# Patient Record
Sex: Female | Born: 1965 | Hispanic: No | Smoking: Never smoker
Health system: Southern US, Community
[De-identification: ages and names within clinical notes are randomized; demographics above are authoritative.]

## PROBLEM LIST (undated history)

## (undated) DIAGNOSIS — N289 Disorder of kidney and ureter, unspecified: Secondary | ICD-10-CM

## (undated) DIAGNOSIS — N200 Calculus of kidney: Secondary | ICD-10-CM

## (undated) HISTORY — PX: APPENDECTOMY: SHX54

---

## 1999-11-04 ENCOUNTER — Other Ambulatory Visit: Admission: RE | Admit: 1999-11-04 | Discharge: 1999-11-04 | Payer: Self-pay | Admitting: Obstetrics and Gynecology

## 2000-04-03 ENCOUNTER — Encounter: Admission: RE | Admit: 2000-04-03 | Discharge: 2000-04-03 | Payer: Self-pay | Admitting: Family Medicine

## 2000-04-03 ENCOUNTER — Encounter: Payer: Self-pay | Admitting: Family Medicine

## 2004-11-10 ENCOUNTER — Ambulatory Visit: Payer: Self-pay | Admitting: Family Medicine

## 2005-01-24 ENCOUNTER — Ambulatory Visit: Payer: Self-pay | Admitting: Family Medicine

## 2005-01-24 ENCOUNTER — Ambulatory Visit: Payer: Self-pay | Admitting: *Deleted

## 2005-01-27 ENCOUNTER — Ambulatory Visit: Payer: Self-pay | Admitting: Family Medicine

## 2005-04-12 ENCOUNTER — Ambulatory Visit: Payer: Self-pay | Admitting: Family Medicine

## 2005-04-15 ENCOUNTER — Ambulatory Visit (HOSPITAL_COMMUNITY): Admission: RE | Admit: 2005-04-15 | Discharge: 2005-04-15 | Payer: Self-pay | Admitting: Family Medicine

## 2005-04-20 ENCOUNTER — Ambulatory Visit (HOSPITAL_COMMUNITY): Admission: RE | Admit: 2005-04-20 | Discharge: 2005-04-20 | Payer: Self-pay | Admitting: Internal Medicine

## 2005-04-25 ENCOUNTER — Ambulatory Visit: Payer: Self-pay | Admitting: Family Medicine

## 2005-11-08 ENCOUNTER — Emergency Department (HOSPITAL_COMMUNITY): Admission: EM | Admit: 2005-11-08 | Discharge: 2005-11-08 | Payer: Self-pay | Admitting: Emergency Medicine

## 2005-11-10 ENCOUNTER — Ambulatory Visit: Payer: Self-pay | Admitting: Family Medicine

## 2006-11-06 ENCOUNTER — Ambulatory Visit: Payer: Self-pay | Admitting: Family Medicine

## 2007-08-08 ENCOUNTER — Encounter (INDEPENDENT_AMBULATORY_CARE_PROVIDER_SITE_OTHER): Payer: Self-pay | Admitting: *Deleted

## 2010-09-02 ENCOUNTER — Emergency Department (HOSPITAL_COMMUNITY)
Admission: EM | Admit: 2010-09-02 | Discharge: 2010-09-02 | Payer: Self-pay | Source: Home / Self Care | Admitting: Emergency Medicine

## 2010-09-08 ENCOUNTER — Emergency Department (HOSPITAL_COMMUNITY): Admission: EM | Admit: 2010-09-08 | Discharge: 2010-09-09 | Payer: Self-pay | Admitting: Emergency Medicine

## 2010-12-12 ENCOUNTER — Encounter: Payer: Self-pay | Admitting: Internal Medicine

## 2010-12-28 ENCOUNTER — Encounter: Payer: Self-pay | Admitting: Internal Medicine

## 2010-12-28 LAB — CONVERTED CEMR LAB
BUN: 13 mg/dL (ref 6–23)
CO2: 27 meq/L (ref 19–32)
Chloride: 102 meq/L (ref 96–112)
Creatinine, Ser: 0.58 mg/dL (ref 0.40–1.20)
HCT: 39.8 % (ref 36.0–46.0)
Hemoglobin: 13 g/dL (ref 12.0–15.0)
WBC: 6.5 10*3/uL (ref 4.0–10.5)

## 2010-12-30 ENCOUNTER — Encounter: Payer: Self-pay | Admitting: Internal Medicine

## 2010-12-30 ENCOUNTER — Other Ambulatory Visit (HOSPITAL_COMMUNITY): Payer: Self-pay | Admitting: Family Medicine

## 2010-12-30 DIAGNOSIS — N200 Calculus of kidney: Secondary | ICD-10-CM

## 2010-12-30 DIAGNOSIS — N1330 Unspecified hydronephrosis: Secondary | ICD-10-CM

## 2011-01-03 ENCOUNTER — Ambulatory Visit (HOSPITAL_COMMUNITY)
Admission: RE | Admit: 2011-01-03 | Discharge: 2011-01-03 | Disposition: A | Discharge status: Home / Self Care | Payer: Self-pay | Source: Ambulatory Visit | Attending: Family Medicine | Admitting: Family Medicine

## 2011-01-03 DIAGNOSIS — R109 Unspecified abdominal pain: Secondary | ICD-10-CM | POA: Insufficient documentation

## 2011-01-03 DIAGNOSIS — N1330 Unspecified hydronephrosis: Secondary | ICD-10-CM

## 2011-01-03 DIAGNOSIS — N200 Calculus of kidney: Secondary | ICD-10-CM | POA: Insufficient documentation

## 2011-01-11 ENCOUNTER — Encounter (INDEPENDENT_AMBULATORY_CARE_PROVIDER_SITE_OTHER): Payer: Self-pay | Admitting: *Deleted

## 2011-01-11 LAB — CONVERTED CEMR LAB
Calcium Ionized: 1.25 mmol/L (ref 1.12–1.32)
Uric Acid, 24H Ur: 275.2 mg/(24.h) (ref 250–750)
pH: 6.5 (ref 5.0–8.0)

## 2011-02-02 LAB — URINALYSIS, ROUTINE W REFLEX MICROSCOPIC
Bilirubin Urine: NEGATIVE
Glucose, UA: NEGATIVE mg/dL
Ketones, ur: NEGATIVE mg/dL
Leukocytes, UA: NEGATIVE
Nitrite: NEGATIVE
Protein, ur: NEGATIVE mg/dL
Specific Gravity, Urine: 1.013 (ref 1.005–1.030)
Urobilinogen, UA: 0.2 mg/dL (ref 0.0–1.0)
pH: 6.5 (ref 5.0–8.0)

## 2011-02-02 LAB — URINE MICROSCOPIC-ADD ON

## 2011-02-02 LAB — URINE CULTURE: Culture: NO GROWTH

## 2011-02-02 LAB — POCT PREGNANCY, URINE: Preg Test, Ur: NEGATIVE

## 2011-02-03 LAB — URINALYSIS, ROUTINE W REFLEX MICROSCOPIC
Bilirubin Urine: NEGATIVE
Glucose, UA: NEGATIVE mg/dL
Ketones, ur: NEGATIVE mg/dL
pH: 5.5 (ref 5.0–8.0)

## 2011-02-03 LAB — URINE CULTURE: Culture  Setup Time: 201110131339

## 2011-02-03 LAB — CBC
MCH: 30.2 pg (ref 26.0–34.0)
MCHC: 33.9 g/dL (ref 30.0–36.0)
Platelets: 176 10*3/uL (ref 150–400)
RBC: 4.27 MIL/uL (ref 3.87–5.11)

## 2011-02-03 LAB — COMPREHENSIVE METABOLIC PANEL
AST: 29 U/L (ref 0–37)
Albumin: 4 g/dL (ref 3.5–5.2)
Calcium: 8.8 mg/dL (ref 8.4–10.5)
Chloride: 108 mEq/L (ref 96–112)
Creatinine, Ser: 1.09 mg/dL (ref 0.4–1.2)
GFR calc Af Amer: >60 mL/min (ref >60)
Sodium: 136 mEq/L (ref 135–145)

## 2011-02-03 LAB — DIFFERENTIAL
Eosinophils Relative: 0 % (ref 0–5)
Lymphocytes Relative: 10 % — ABNORMAL LOW (ref 12–46)
Lymphs Abs: 0.6 10*3/uL — ABNORMAL LOW (ref 0.7–4.0)
Monocytes Absolute: 0 10*3/uL — ABNORMAL LOW (ref 0.1–1.0)

## 2011-02-03 LAB — URINE MICROSCOPIC-ADD ON

## 2011-04-13 ENCOUNTER — Emergency Department (HOSPITAL_COMMUNITY)
Admission: EM | Admit: 2011-04-13 | Discharge: 2011-04-14 | Disposition: A | Discharge status: Home / Self Care | Payer: Self-pay | Attending: Emergency Medicine | Admitting: Emergency Medicine

## 2011-04-13 ENCOUNTER — Emergency Department (HOSPITAL_COMMUNITY): Payer: Self-pay

## 2011-04-13 DIAGNOSIS — R109 Unspecified abdominal pain: Secondary | ICD-10-CM | POA: Insufficient documentation

## 2011-04-13 LAB — URINALYSIS, ROUTINE W REFLEX MICROSCOPIC
Glucose, UA: NEGATIVE mg/dL
Protein, ur: NEGATIVE mg/dL
pH: 7.5 (ref 5.0–8.0)

## 2011-10-12 IMAGING — CT CT ABD-PELV W/O CM
2 of 4 series · 17 of 46 positions shown, 19 images · non-contrast
Comparison: None.

CLINICAL DATA: Renal calculi.  Bilateral flank pain 3 weeks ago.

CT ABDOMEN AND PELVIS WITHOUT CONTRAST
TECHNIQUE: Multidetector CT imaging of the abdomen and pelvis was
performed following the standard protocol without intravenous
contrast.

[Series 3: a/p w/o 5.0 b31f st · axial · non-contrast · 0.59mm/px · z∈[-445,-75]mm · 14 of 82 slices shown, 16 images]
[im 4/82  soft-tissue]
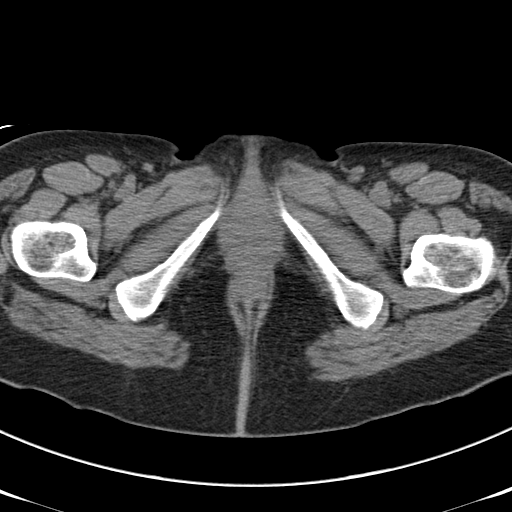
[im 4/82  bone]
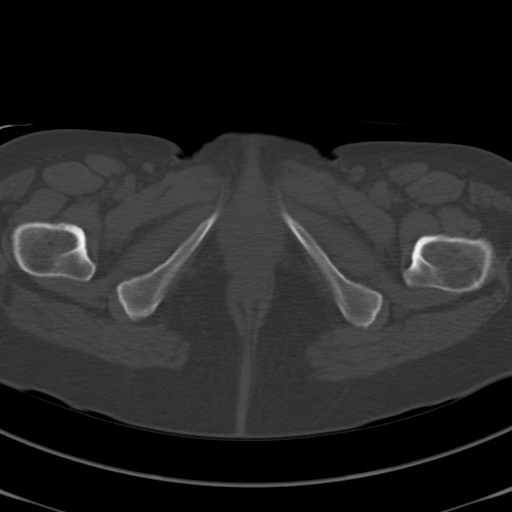
[im 11/82  soft-tissue]
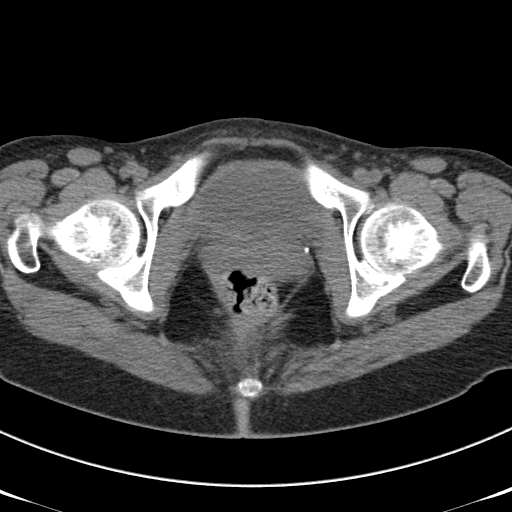
[im 17/82  soft-tissue]
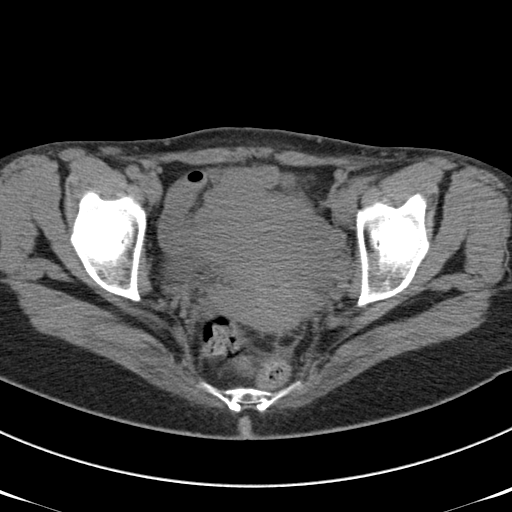
[im 21/82  soft-tissue]
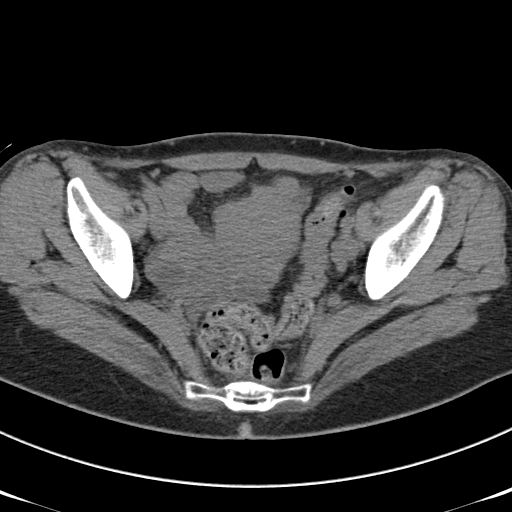
[im 28/82  soft-tissue]
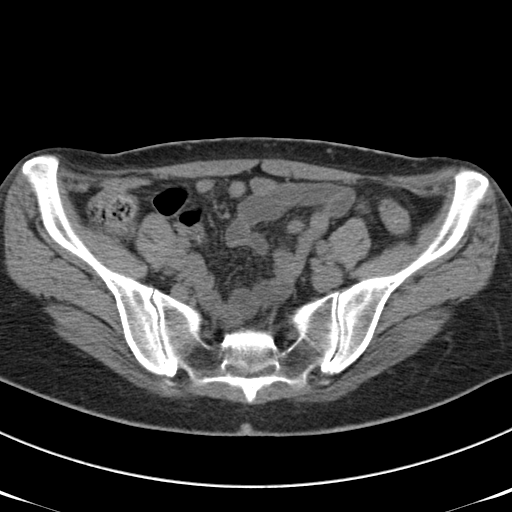
[im 34/82  soft-tissue]
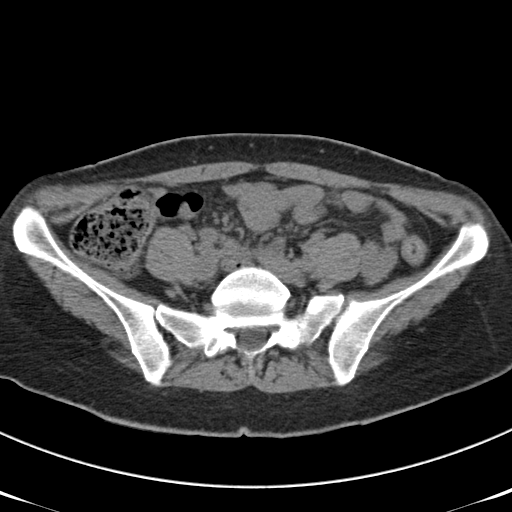
[im 38/82  soft-tissue]
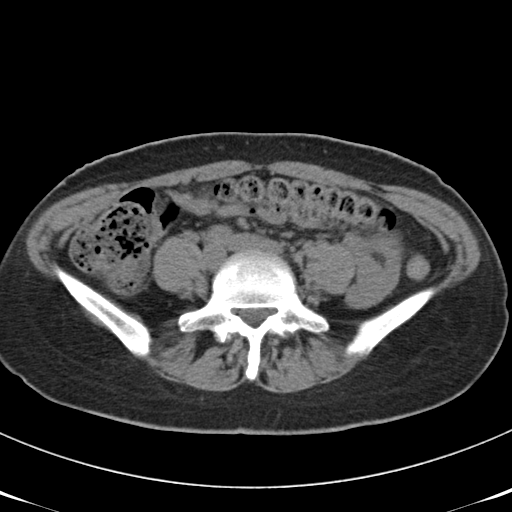
[im 44/82  soft-tissue]
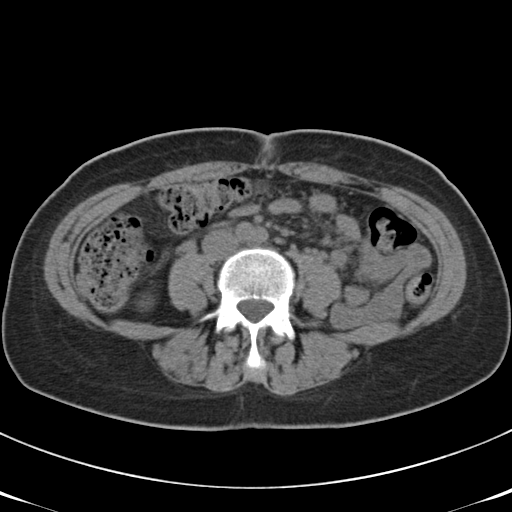
[im 48/82  soft-tissue]
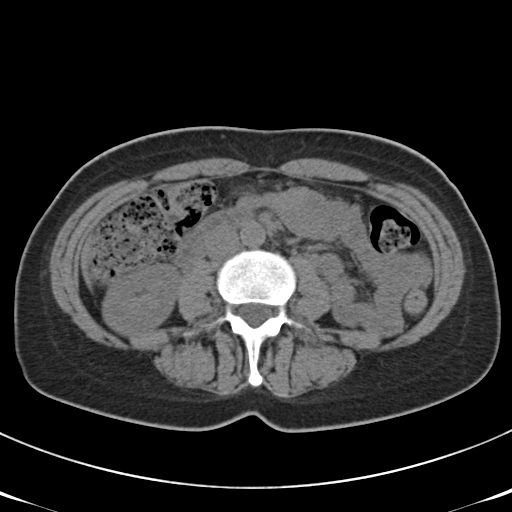
[im 48/82  bone]
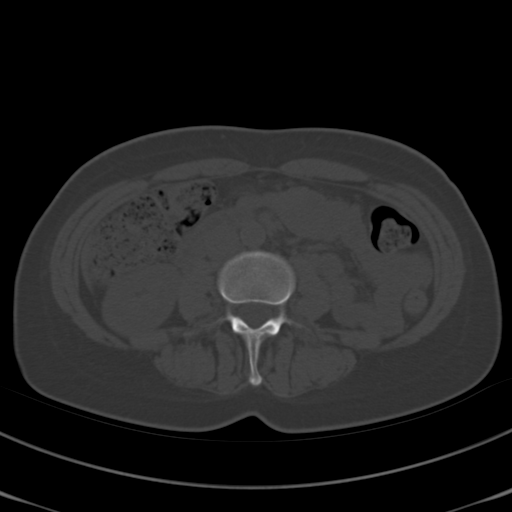
[im 55/82  soft-tissue]
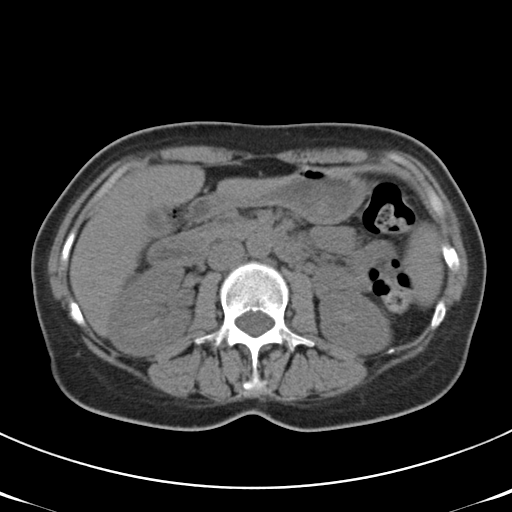
[im 61/82  soft-tissue]
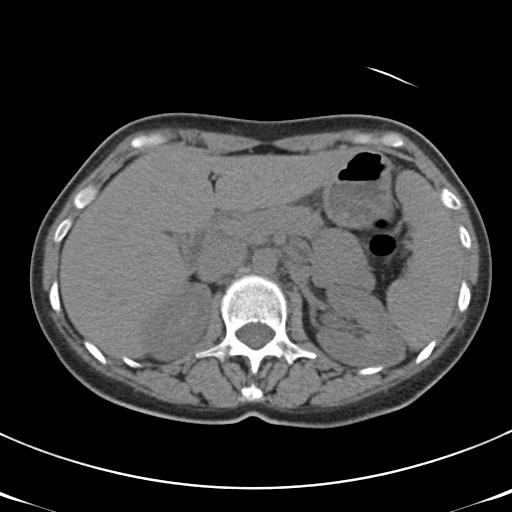
[im 65/82  soft-tissue]
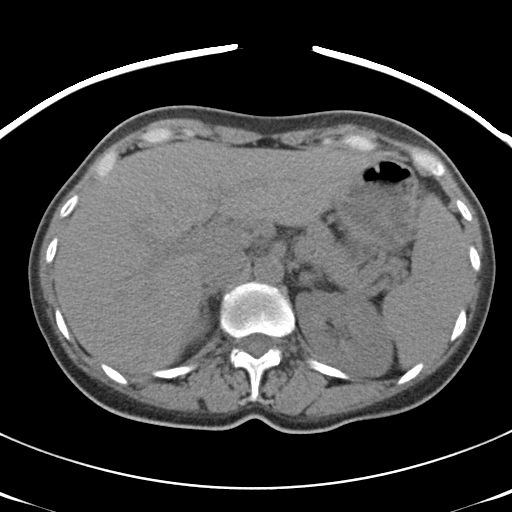
[im 71/82  soft-tissue]
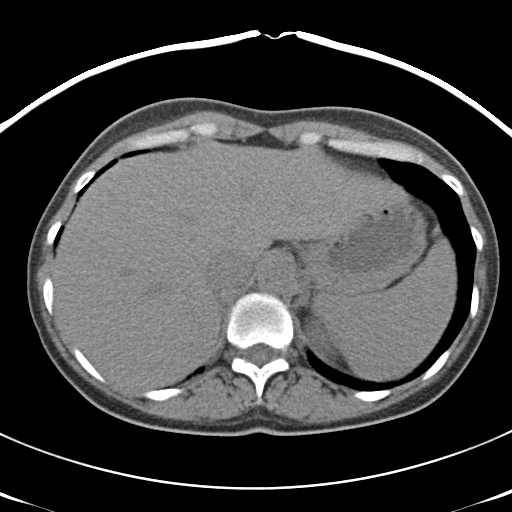
[im 78/82  soft-tissue]
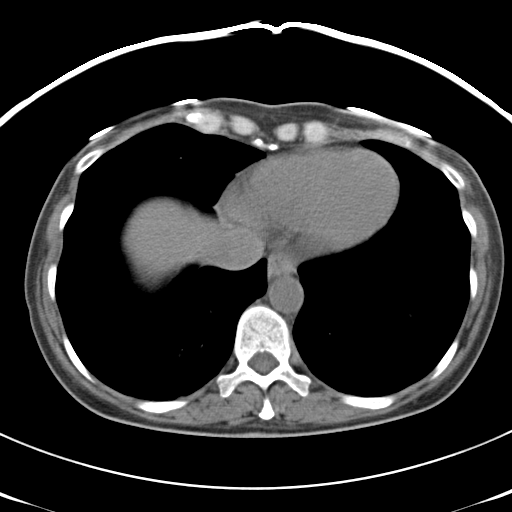

[Series 602: cor · coronal · 0.80mm/px · 3 of 81 slices shown]
[im 27/81  soft-tissue]
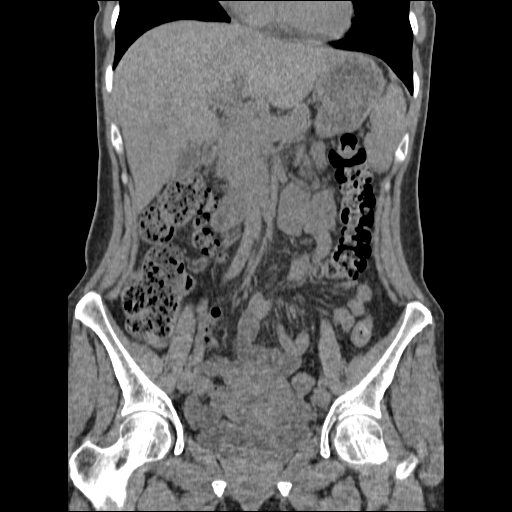
[im 36/81  soft-tissue]
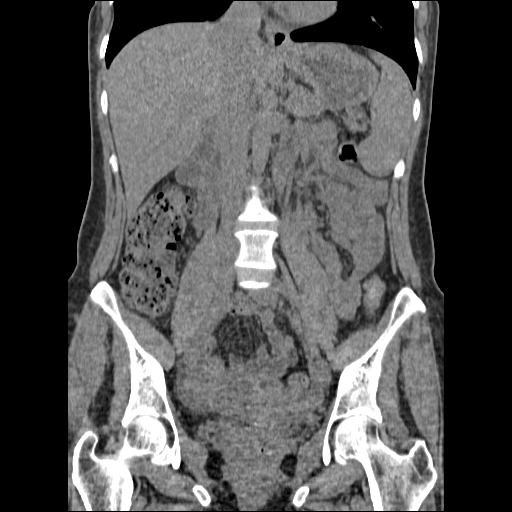
[im 45/81  soft-tissue]
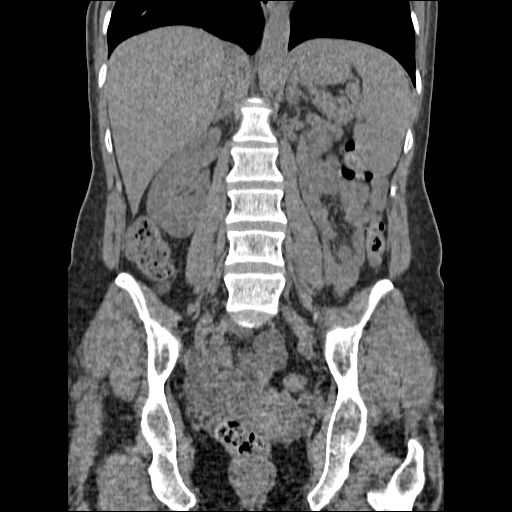

[17 of 46 positions shown; findings below may reference images not displayed]

FINDINGS: There is a 2 mm left mid kidney nonobstructive calculus
and a 102 mm left kidney lower pole nonobstructive calculus.  There
appear to be to the right mid kidney calculi measuring
approximately 2 mm in diameter, and potentially several other tiny
nonobstructive right renal calculi.

No hydronephrosis or hydroureter.  A stable left pelvic phlebolith
is present.

The liver, spleen, pancreas, and adrenal glands appear
unremarkable.

The gallbladder appears contracted.

Small retroperitoneal lymph nodes are not pathologically enlarged
by size criteria.

Trace free pelvic fluid noted. No pathologic pelvic adenopathy is
identified.

Urinary bladder appears unremarkable.  The appendix is not well
visualized but no right lower quadrant inflammatory process is
seen.
IMPRESSION: 1.  Bilateral nonobstructive nephrolithiasis.  No ureteral calculus
or hydronephrosis identified.
2.  Trace free pelvic fluid may be physiologic.

## 2012-08-09 ENCOUNTER — Other Ambulatory Visit (HOSPITAL_COMMUNITY): Payer: Self-pay | Admitting: Internal Medicine

## 2012-08-09 DIAGNOSIS — Z1231 Encounter for screening mammogram for malignant neoplasm of breast: Secondary | ICD-10-CM

## 2012-08-16 DIAGNOSIS — S6990XA Unspecified injury of unspecified wrist, hand and finger(s), initial encounter: Secondary | ICD-10-CM | POA: Insufficient documentation

## 2012-08-16 DIAGNOSIS — N289 Disorder of kidney and ureter, unspecified: Secondary | ICD-10-CM | POA: Insufficient documentation

## 2012-08-16 DIAGNOSIS — IMO0002 Reserved for concepts with insufficient information to code with codable children: Secondary | ICD-10-CM | POA: Insufficient documentation

## 2012-08-16 NOTE — ED Notes (Signed)
Pt states she was playing with her daughter and injured her R third finger to where the joint was swollen and she felt "nerve pain" going up the ulnar side of her R arm.  Then she accidentally shut the door on the same finger and pain increased.

## 2012-08-17 ENCOUNTER — Emergency Department (HOSPITAL_COMMUNITY): Payer: Medicaid Other

## 2012-08-17 ENCOUNTER — Encounter (HOSPITAL_COMMUNITY): Payer: Self-pay | Admitting: *Deleted

## 2012-08-17 ENCOUNTER — Emergency Department (HOSPITAL_COMMUNITY)
Admission: EM | Admit: 2012-08-17 | Discharge: 2012-08-17 | Disposition: A | Discharge status: Home / Self Care | Payer: Medicaid Other | Attending: Emergency Medicine | Admitting: Emergency Medicine

## 2012-08-17 DIAGNOSIS — S6000XA Contusion of unspecified finger without damage to nail, initial encounter: Secondary | ICD-10-CM

## 2012-08-17 HISTORY — DX: Disorder of kidney and ureter, unspecified: N28.9

## 2012-08-17 MED ORDER — HYDROCODONE-ACETAMINOPHEN 5-325 MG PO TABS
1.0000 | ORAL_TABLET | Freq: Once | ORAL | Status: AC
  Administered 2012-08-17: 1 via ORAL
  Filled 2012-08-17: qty 1

## 2012-08-17 MED ORDER — HYDROCODONE-ACETAMINOPHEN 5-500 MG PO TABS: 1.0000 | ORAL_TABLET | Freq: Four times a day (QID) | ORAL | Status: DC | PRN

## 2012-08-17 NOTE — ED Provider Notes (Signed)
History     CSN: [id number]  Arrival date & time 08/16/12  2350   None     Chief Complaint  Patient presents with  . Finger Injury    (Consider location/radiation/quality/duration/timing/severity/associated sxs/prior treatment) Patient is a 46 y.o. female presenting with hand injury.  Hand Injury  The incident occurred 3 to 5 hours ago. The injury mechanism was a direct blow. The pain is present in the right fingers. The quality of the pain is described as aching. The pain is at a severity of 4/10. The pain is mild. The pain has been constant since the incident. She reports no foreign bodies present.    Past Medical History  Diagnosis Date  . Renal disorder     kidney stones    History reviewed. No pertinent past surgical history.  No family history on file.  History  Substance Use Topics  . Smoking status: Never Smoker   . Smokeless tobacco: Not on file  . Alcohol Use: No    OB History    Grav Para Term Preterm Abortions TAB SAB Ect Mult Living                  Review of Systems  Musculoskeletal: Positive for joint swelling.  All other systems reviewed and are negative.    Allergies  Review of patient's allergies indicates no known allergies.  Home Medications  No current outpatient prescriptions on file.  BP 132/81  Pulse 71  Temp 98.6 F (37 C) (Oral)  Resp 18  SpO2 98%  Physical Exam  Constitutional: She is oriented to person, place, and time. She appears well-developed and well-nourished.  HENT:  Head: Normocephalic and atraumatic.  Eyes: Conjunctivae normal and EOM are normal. Pupils are equal, round, and reactive to light.  Neck: Normal range of motion.  Cardiovascular: Normal rate, regular rhythm and normal heart sounds.   Pulmonary/Chest: Effort normal and breath sounds normal.  Abdominal: Soft. Bowel sounds are normal.  Musculoskeletal: Normal range of motion.       Tenderness to palpation to rt 3 finger  Neurological: She is alert  and oriented to person, place, and time.  Skin: Skin is warm and dry.  Psychiatric: She has a normal mood and affect. Her behavior is normal.    ED Course  Procedures (including critical care time)  Labs Reviewed - No data to display Dg Hand Complete Right  08/17/2012  *RADIOLOGY REPORT*  Clinical Data: 46 year old female blunt trauma to the middle finger.  Pain.  RIGHT HAND - COMPLETE 3+ VIEW  Comparison: None.  Findings: Distal radius and ulna intact.  Carpal bone alignment within normal limits.  Joint spaces preserved.  Osseous structures of the right third ray are intact.  No acute fracture identified.  IMPRESSION: No acute fracture or dislocation identified about the right hand.   Original Report Authenticated By: Harley Hallmark, M.D.      No diagnosis found.    MDM  No fracture noted.  Improved.  Will dc to [facility] outpt,  Ret new/worsening sxs        Gerhardt Gleed Lytle Michaels, MD 08/17/12 0246

## 2012-08-20 ENCOUNTER — Ambulatory Visit (HOSPITAL_COMMUNITY)
Admission: RE | Admit: 2012-08-20 | Discharge: 2012-08-20 | Disposition: A | Discharge status: Home / Self Care | Payer: Self-pay | Source: Ambulatory Visit | Attending: Internal Medicine | Admitting: Internal Medicine

## 2012-08-20 DIAGNOSIS — Z1231 Encounter for screening mammogram for malignant neoplasm of breast: Secondary | ICD-10-CM | POA: Insufficient documentation

## 2013-03-17 ENCOUNTER — Emergency Department (HOSPITAL_COMMUNITY): Payer: Medicaid Other

## 2013-03-17 ENCOUNTER — Emergency Department (HOSPITAL_COMMUNITY)
Admission: EM | Admit: 2013-03-17 | Discharge: 2013-03-17 | Disposition: A | Discharge status: Home / Self Care | Payer: Medicaid Other | Attending: Emergency Medicine | Admitting: Emergency Medicine

## 2013-03-17 ENCOUNTER — Encounter (HOSPITAL_COMMUNITY): Payer: Self-pay | Admitting: *Deleted

## 2013-03-17 DIAGNOSIS — Z79899 Other long term (current) drug therapy: Secondary | ICD-10-CM | POA: Insufficient documentation

## 2013-03-17 DIAGNOSIS — Z87442 Personal history of urinary calculi: Secondary | ICD-10-CM | POA: Insufficient documentation

## 2013-03-17 DIAGNOSIS — R109 Unspecified abdominal pain: Secondary | ICD-10-CM

## 2013-03-17 HISTORY — DX: Calculus of kidney: N20.0

## 2013-03-17 LAB — CBC WITH DIFFERENTIAL/PLATELET
Basophils Absolute: 0 10*3/uL (ref 0.0–0.1)
HCT: 36.4 % (ref 36.0–46.0)
Lymphocytes Relative: 47 % — ABNORMAL HIGH (ref 12–46)
Monocytes Absolute: 0.4 10*3/uL (ref 0.1–1.0)
Neutro Abs: 2.3 10*3/uL (ref 1.7–7.7)
Neutrophils Relative %: 43 % (ref 43–77)
RDW: 13.6 % (ref 11.5–15.5)
WBC: 5.3 10*3/uL (ref 4.0–10.5)

## 2013-03-17 LAB — URINALYSIS, ROUTINE W REFLEX MICROSCOPIC
Leukocytes, UA: NEGATIVE
Nitrite: NEGATIVE
Specific Gravity, Urine: 1.021 (ref 1.005–1.030)
pH: 6 (ref 5.0–8.0)

## 2013-03-17 LAB — COMPREHENSIVE METABOLIC PANEL
AST: 13 U/L (ref 0–37)
Albumin: 3.6 g/dL (ref 3.5–5.2)
Alkaline Phosphatase: 54 U/L (ref 39–117)
Chloride: 105 mEq/L (ref 96–112)
Potassium: 3.1 mEq/L — ABNORMAL LOW (ref 3.5–5.1)
Sodium: 139 mEq/L (ref 135–145)
Total Bilirubin: 0.3 mg/dL (ref 0.3–1.2)
Total Protein: 7.1 g/dL (ref 6.0–8.3)

## 2013-03-17 MED ORDER — POTASSIUM CHLORIDE CRYS ER 20 MEQ PO TBCR
20.0000 meq | EXTENDED_RELEASE_TABLET | Freq: Once | ORAL | Status: AC
  Administered 2013-03-17: 20 meq via ORAL
  Filled 2013-03-17: qty 1

## 2013-03-17 MED ORDER — HYDROCODONE-ACETAMINOPHEN 5-325 MG PO TABS: 2.0000 | ORAL_TABLET | ORAL | Status: DC | PRN

## 2013-03-17 MED ORDER — HYDROCODONE-ACETAMINOPHEN 5-325 MG PO TABS
1.0000 | ORAL_TABLET | Freq: Once | ORAL | Status: AC
  Administered 2013-03-17: 1 via ORAL
  Filled 2013-03-17: qty 1

## 2013-03-17 MED ORDER — CYCLOBENZAPRINE HCL 10 MG PO TABS: 10.0000 mg | ORAL_TABLET | Freq: Two times a day (BID) | ORAL | Status: DC | PRN

## 2013-03-17 MED ORDER — CYCLOBENZAPRINE HCL 10 MG PO TABS
10.0000 mg | ORAL_TABLET | Freq: Once | ORAL | Status: AC
  Administered 2013-03-17: 10 mg via ORAL
  Filled 2013-03-17: qty 1

## 2013-03-17 MED ORDER — POTASSIUM CHLORIDE CRYS ER 20 MEQ PO TBCR: 20.0000 meq | EXTENDED_RELEASE_TABLET | Freq: Two times a day (BID) | ORAL | Status: DC

## 2013-03-17 NOTE — ED Provider Notes (Signed)
History     CSN: [id number]  Arrival date & time 03/17/13  9562   First MD Initiated Contact with Patient 03/17/13 0145      Chief Complaint  Patient presents with  . Flank Pain    (Consider location/radiation/quality/duration/timing/severity/associated sxs/prior treatment) HPI Comments: Patient has a history of kidney stands that intermittently bothers her over the last several years.  Over the last couple, days.  She's had increased left flank pain, radiating to her left lower abdomen, denies any nausea, vomiting, diarrhea, fever, or dysuria.  Blood in her urine.  She's been taking over-the-counter Advil without significant pain relief.  She called her physician and was given an appointment date of May 29 she did not feel that she could wait that long  Patient is a 47 y.o. female presenting with flank pain. The history is provided by the patient.  Flank Pain This is a chronic problem. The current episode started in the past 7 days. The problem has been gradually worsening. Pertinent negatives include no abdominal pain, fever, myalgias, nausea or vomiting. Nothing aggravates the symptoms. She has tried NSAIDs for the symptoms. The treatment provided no relief.    Past Medical History  Diagnosis Date  . Renal disorder     kidney stones  . Kidney stones     No past surgical history on file.  History reviewed. No pertinent family history.  History  Substance Use Topics  . Smoking status: Never Smoker   . Smokeless tobacco: Not on file  . Alcohol Use: No    OB History   Grav Para Term Preterm Abortions TAB SAB Ect Mult Living                  Review of Systems  Constitutional: Negative for fever.  Gastrointestinal: Negative for nausea, vomiting and abdominal pain.  Genitourinary: Positive for flank pain. Negative for dysuria, hematuria and vaginal discharge.  Musculoskeletal: Negative for myalgias.  Skin: Negative for wound.  All other systems reviewed and are  negative.    Allergies  Review of patient's allergies indicates no known allergies.  Home Medications   Current Outpatient Rx  Name  Route  Sig  Dispense  Refill  . cyclobenzaprine (FLEXERIL) 10 MG tablet   Oral   Take 1 tablet (10 mg total) by mouth 2 (two) times daily as needed for muscle spasms.   20 tablet   0   . HYDROcodone-acetaminophen (NORCO/VICODIN) 5-325 MG per tablet   Oral   Take 2 tablets by mouth every 4 (four) hours as needed for pain.   6 tablet   0   . HYDROcodone-acetaminophen (VICODIN) 5-500 MG per tablet   Oral   Take 1-2 tablets by mouth every 6 (six) hours as needed for pain.   15 tablet   0   . potassium chloride SA (K-DUR,KLOR-CON) 20 MEQ tablet   Oral   Take 1 tablet (20 mEq total) by mouth 2 (two) times daily.   6 tablet   0     BP 106/79  Pulse 62  Temp(Src) 97.9 F (36.6 C) (Oral)  Resp 16  SpO2 100%  Physical Exam  Vitals reviewed. Constitutional: She is oriented to person, place, and time. She appears well-developed and well-nourished.  HENT:  Head: Normocephalic.  Eyes: Pupils are equal, round, and reactive to light.  Neck: Normal range of motion.  Cardiovascular: Normal rate.   Pulmonary/Chest: Effort normal.  Abdominal: Soft. Bowel sounds are normal.  Musculoskeletal: Normal range of  motion.  Neurological: She is alert and oriented to person, place, and time.  Skin: Skin is warm. No rash noted. No pallor.    ED Course  Procedures (including critical care time)  Labs Reviewed  CBC WITH DIFFERENTIAL - Abnormal; Notable for the following:    Lymphocytes Relative 47 (*)    All other components within normal limits  URINALYSIS, ROUTINE W REFLEX MICROSCOPIC - Abnormal; Notable for the following:    Hgb urine dipstick SMALL (*)    All other components within normal limits  COMPREHENSIVE METABOLIC PANEL - Abnormal; Notable for the following:    Potassium 3.1 (*)    All other components within normal limits  URINE  MICROSCOPIC-ADD ON   Ct Abdomen Pelvis Wo Contrast  03/17/2013  *RADIOLOGY REPORT*  Clinical Data: Left flank pain.  CT ABDOMEN AND PELVIS WITHOUT CONTRAST  Technique:  Multidetector CT imaging of the abdomen and pelvis was performed following the standard protocol without intravenous contrast.  Comparison: 04/13/2011  Findings: Emphysematous changes and scattered fibrosis in the lung bases.  Multiple tiny intrarenal stones bilaterally, measuring less than 2 mm diameter.  No pyelocaliectasis or ureterectasis of either kidney.  No ureteral or bladder stones are demonstrated.  No bladder wall thickening.  The unenhanced appearance of the liver, spleen, gallbladder, pancreas, adrenal glands, abdominal aorta, inferior vena cava, stomach, small bowel, and colon is unremarkable.  No free air or free fluid in the abdomen.  Pelvis:  Low attenuation lesion in the right adnexa measuring 4.1 cm consistent with an ovarian cyst.  This is new since the previous study and is likely to be physiologic in a premenopausal patient. Uterus and left adnexal structures are not enlarged.  No significant pelvic lymphadenopathy.  History of surgical absence of the appendix.  No evidence of diverticulitis.  No free or loculated pelvic fluid collections.  Normal alignment of the lumbar vertebrae.  IMPRESSION: Multiple tiny nonobstructing intrarenal stones bilaterally.  This appears stable since previous study.  No acute process demonstrated in the abdomen or pelvis on unenhanced imaging.  A 4.1 cm cyst in the right adnexa.  This is likely physiologic in a premenopausal patient.   Original Report Authenticated By: Burman Nieves, M.D.      1. Flank pain       MDM  Patient has small amount of blood in her urine.  Will CT to rule out stones        Arman Filter, NP 03/17/13 2158

## 2013-03-17 NOTE — ED Provider Notes (Signed)
Medical screening examination/treatment/procedure(s) were performed by non-physician practitioner and as supervising physician I was immediately available for consultation/collaboration.  Dimitrious Micciche, MD 03/17/13 0735 

## 2013-03-17 NOTE — ED Provider Notes (Signed)
CT negative for kidney stones. Labs insignificant, no N, V, fever. ?Muscular pain. Will treat symptomatically and refer to PCP for recheck.     Arnoldo Hooker, PA-C 03/17/13 [phone]

## 2013-03-17 NOTE — ED Notes (Signed)
Pt states she has had kidney stones for two years and her medicaid don't cover surgery and her doctor doesn't really do anything, and her kidney stones are still bothering her, but states she isn't sure who her doctor is or where he is at.  Pt is alert and oriented

## 2013-03-18 NOTE — ED Provider Notes (Signed)
Medical screening examination/treatment/procedure(s) were performed by non-physician practitioner and as supervising physician I was immediately available for consultation/collaboration.  Ingram Onnen, MD 03/18/13 1809 

## 2013-12-04 ENCOUNTER — Emergency Department (HOSPITAL_COMMUNITY): Payer: Medicaid Other

## 2013-12-04 ENCOUNTER — Encounter (HOSPITAL_COMMUNITY): Payer: Self-pay | Admitting: Emergency Medicine

## 2013-12-04 ENCOUNTER — Emergency Department (HOSPITAL_COMMUNITY)
Admission: EM | Admit: 2013-12-04 | Discharge: 2013-12-04 | Disposition: A | Discharge status: Home / Self Care | Payer: Medicaid Other | Attending: Emergency Medicine | Admitting: Emergency Medicine

## 2013-12-04 DIAGNOSIS — R296 Repeated falls: Secondary | ICD-10-CM | POA: Insufficient documentation

## 2013-12-04 DIAGNOSIS — S8010XA Contusion of unspecified lower leg, initial encounter: Secondary | ICD-10-CM | POA: Insufficient documentation

## 2013-12-04 DIAGNOSIS — Y929 Unspecified place or not applicable: Secondary | ICD-10-CM | POA: Insufficient documentation

## 2013-12-04 DIAGNOSIS — W19XXXA Unspecified fall, initial encounter: Secondary | ICD-10-CM

## 2013-12-04 DIAGNOSIS — Y9301 Activity, walking, marching and hiking: Secondary | ICD-10-CM | POA: Insufficient documentation

## 2013-12-04 DIAGNOSIS — Z87442 Personal history of urinary calculi: Secondary | ICD-10-CM | POA: Insufficient documentation

## 2013-12-04 DIAGNOSIS — S8012XA Contusion of left lower leg, initial encounter: Secondary | ICD-10-CM

## 2013-12-04 MED ORDER — IBUPROFEN 800 MG PO TABS: 800.0000 mg | ORAL_TABLET | Freq: Three times a day (TID) | ORAL | Status: DC

## 2013-12-04 MED ORDER — IBUPROFEN 800 MG PO TABS
800.0000 mg | ORAL_TABLET | Freq: Once | ORAL | Status: AC
  Administered 2013-12-04: 800 mg via ORAL
  Filled 2013-12-04: qty 1

## 2013-12-04 NOTE — ED Provider Notes (Signed)
CSN: [id number]     Arrival date & time 12/04/13  2000 History  This chart was scribed for non-physician practitioner, Ivonne AndrewPeter Alva Kuenzel, PA-C,working with Rolland PorterMark James, MD, by Karle PlumberJennifer Tensley, ED Scribe.  This patient was seen in room WTR6/WTR6 and the patient's care was started at 9:03 PM.  Chief Complaint  Patient presents with  . Leg Pain   The history is provided by the patient. No language interpreter was used.   HPI Comments:  Brittany [name] is a 48 y.o. female who presents to the Emergency Department complaining of left lower extremity pain that started secondary to a fall approximately 4 hours ago. She reports pain to the lateral left leg and states it hurts to bear weight, however she is able to ambulate. She denies head injury, LOC, numbness, weakness, or tingling. She reports h/o kidney stones, but denies any other chronic illnesses. She did not use any treatments for her symptoms. Denies any other aggravating or alleviating factors. No other associated symptoms.  Past Medical History  Diagnosis Date  . Renal disorder     kidney stones  . Kidney stones    History reviewed. No pertinent past surgical history. Family History  Problem Relation Age of Onset  . Diabetes Mother   . Diabetes Father    History  Substance Use Topics  . Smoking status: Never Smoker   . Smokeless tobacco: Not on file  . Alcohol Use: No   OB History   Grav Para Term Preterm Abortions TAB SAB Ect Mult Living                 Review of Systems  Musculoskeletal: Positive for myalgias (LLE).  Neurological: Negative for syncope, weakness and numbness.  All other systems reviewed and are negative.    Allergies  Review of patient's allergies indicates no known allergies.  Home Medications   Current Outpatient Rx  Name  Route  Sig  Dispense  Refill  . ibuprofen (ADVIL,MOTRIN) 800 MG tablet   Oral   Take 1 tablet (800 mg total) by mouth 3 (three) times daily.   21 tablet   0    Triage Vitals:  BP 108/68  Pulse 71  Temp(Src) 98.4 F (36.9 C) (Oral)  Resp 15  Ht 5\' 6"  (1.676 m)  Wt 122 lb (55.339 kg)  BMI 19.70 kg/m2  SpO2 96% Physical Exam  Nursing note and vitals reviewed. Constitutional: She is oriented to person, place, and time. She appears well-developed and well-nourished.  HENT:  Head: Normocephalic and atraumatic.  Eyes: EOM are normal.  Neck: Normal range of motion.  Cardiovascular: Normal rate, regular rhythm and normal heart sounds.   Normal dorsal pedis pulse of LLE.   Pulmonary/Chest: Effort normal.  Musculoskeletal: Normal range of motion. She exhibits edema and tenderness.  No pain or deformity along the tibia. There is a small amount of bruising with underlying hematoma to the lateral aspect of the mid left leg overlying the fibula. Tenderness along this area extending up to the proximal fibular area. No gross deformities. Normal range of motion at the knee. Normal ankle examination. Normal dorsal pedal pulses and sensation in foot.  Neurological: She is alert and oriented to person, place, and time.  Normal sensations to light touch.   Skin: Skin is warm and dry.  Bruise to midlateral LLE.   Psychiatric: She has a normal mood and affect. Her behavior is normal.    ED Course  Procedures  DIAGNOSTIC STUDIES: Oxygen Saturation is 96%  on RA, adequate by my interpretation.   COORDINATION OF CARE: 9:07 PM- Will X-Ray left leg. Will give pt pain medication prior to X-Ray. Pt verbalizes understanding and agrees to plan.  X-rays reviewed. No signs of fracture or growth concerning injury. Patient with signs of contusion and mild hematoma. We'll recommend Rice therapy. Patient is complaining of significant pain with ambulation and we will provide crutches for temporary rest and relief.  Imaging Review Dg Tibia/fibula Left  12/04/2013   CLINICAL DATA:  Pain post trauma  EXAM: LEFT TIBIA AND FIBULA - 2 VIEW  COMPARISON:  None.  FINDINGS: Frontal and lateral views  were obtained. There is no fracture or dislocation. Joint spaces appear intact. There is no abnormal periosteal reaction.  IMPRESSION: No abnormality noted.   Electronically Signed   By: Bretta Bang M.D.   On: 12/04/2013 21:25    MDM   1. Contusion of leg, left   2. Fall     I personally performed the services described in this documentation, which was scribed in my presence. The recorded information has been reviewed and is accurate.    Angus Seller, PA-C 12/04/13 2145

## 2013-12-04 NOTE — Discharge Instructions (Signed)
Your x-rays do not show any broken bones near fall and injury. At this time it provider so you have bruising and swelling to the muscles and tissues in your leg. Use rest, ice, compression and elevation to reduce pain and swelling. Please followup with your primary care provider for continued evaluation and treatment.    Contusion A contusion is a deep bruise. Contusions are the result of an injury that caused bleeding under the skin. The contusion may turn blue, purple, or yellow. Minor injuries will give you a painless contusion, but more severe contusions may stay painful and swollen for a few weeks.  CAUSES  A contusion is usually caused by a blow, trauma, or direct force to an area of the body. SYMPTOMS   Swelling and redness of the injured area.  Bruising of the injured area.  Tenderness and soreness of the injured area.  Pain. DIAGNOSIS  The diagnosis can be made by taking a history and physical exam. An X-ray, CT scan, or MRI may be needed to determine if there were any associated injuries, such as fractures. TREATMENT  Specific treatment will depend on what area of the body was injured. In general, the best treatment for a contusion is resting, icing, elevating, and applying cold compresses to the injured area. Over-the-counter medicines may also be recommended for pain control. Ask your caregiver what the best treatment is for your contusion. HOME CARE INSTRUCTIONS   Put ice on the injured area.  Put ice in a plastic bag.  Place a towel between your skin and the bag.  Leave the ice on for 15-20 minutes, 03-04 times a day.  Only take over-the-counter or prescription medicines for pain, discomfort, or fever as directed by your caregiver. Your caregiver may recommend avoiding anti-inflammatory medicines (aspirin, ibuprofen, and naproxen) for 48 hours because these medicines may increase bruising.  Rest the injured area.  If possible, elevate the injured area to reduce  swelling. SEEK IMMEDIATE MEDICAL CARE IF:   You have increased bruising or swelling.  You have pain that is getting worse.  Your swelling or pain is not relieved with medicines. MAKE SURE YOU:   Understand these instructions.  Will watch your condition.  Will get help right away if you are not doing well or get worse. Document Released: 08/17/2005 Document Revised: 01/30/2012 Document Reviewed: 09/12/2011 [facility] Patient Information 2014 [place], [place].

## 2013-12-04 NOTE — ED Notes (Signed)
Pt states she was walking and stepped on a decline on the sidewalk and fell  Pt is c/o pain to her left lower lateral leg  Pt states it hurts to bear weight No obvious deformity noted

## 2013-12-07 NOTE — ED Provider Notes (Signed)
Medical screening examination/treatment/procedure(s) were performed by non-physician practitioner and as supervising physician I was immediately available for consultation/collaboration.  EKG Interpretation   None         Rolland PorterMark Jontavius Rabalais, MD 12/07/13 [phone]

## 2013-12-30 ENCOUNTER — Emergency Department (HOSPITAL_COMMUNITY)
Admission: EM | Admit: 2013-12-30 | Discharge: 2013-12-30 | Discharge status: Against Medical Advice | Payer: Medicaid Other | Attending: Emergency Medicine | Admitting: Emergency Medicine

## 2013-12-30 ENCOUNTER — Encounter (HOSPITAL_COMMUNITY): Payer: Self-pay | Admitting: Emergency Medicine

## 2013-12-30 DIAGNOSIS — R109 Unspecified abdominal pain: Secondary | ICD-10-CM | POA: Insufficient documentation

## 2013-12-30 DIAGNOSIS — K089 Disorder of teeth and supporting structures, unspecified: Secondary | ICD-10-CM | POA: Insufficient documentation

## 2013-12-30 DIAGNOSIS — Z87442 Personal history of urinary calculi: Secondary | ICD-10-CM | POA: Insufficient documentation

## 2013-12-30 LAB — CBC WITH DIFFERENTIAL/PLATELET
BASOS PCT: 0 % (ref 0–1)
Basophils Absolute: 0 10*3/uL (ref 0.0–0.1)
EOS ABS: 0.1 10*3/uL (ref 0.0–0.7)
Eosinophils Relative: 1 % (ref 0–5)
HCT: 37.5 % (ref 36.0–46.0)
HEMOGLOBIN: 12.7 g/dL (ref 12.0–15.0)
LYMPHS ABS: 2.9 10*3/uL (ref 0.7–4.0)
Lymphocytes Relative: 53 % — ABNORMAL HIGH (ref 12–46)
MCH: 29.3 pg (ref 26.0–34.0)
MCHC: 33.9 g/dL (ref 30.0–36.0)
MCV: 86.4 fL (ref 78.0–100.0)
Monocytes Absolute: 0.3 10*3/uL (ref 0.1–1.0)
Monocytes Relative: 6 % (ref 3–12)
NEUTROS ABS: 2.2 10*3/uL (ref 1.7–7.7)
NEUTROS PCT: 40 % — AB (ref 43–77)
PLATELETS: 201 10*3/uL (ref 150–400)
RBC: 4.34 MIL/uL (ref 3.87–5.11)
RDW: 14.1 % (ref 11.5–15.5)
WBC: 5.5 10*3/uL (ref 4.0–10.5)

## 2013-12-30 LAB — COMPREHENSIVE METABOLIC PANEL
ALBUMIN: 4.1 g/dL (ref 3.5–5.2)
ALK PHOS: 60 U/L (ref 39–117)
ALT: 9 U/L (ref 0–35)
AST: 14 U/L (ref 0–37)
BILIRUBIN TOTAL: 0.2 mg/dL — AB (ref 0.3–1.2)
BUN: 13 mg/dL (ref 6–23)
CHLORIDE: 101 meq/L (ref 96–112)
CO2: 28 mEq/L (ref 19–32)
Calcium: 9.6 mg/dL (ref 8.4–10.5)
Creatinine, Ser: 0.59 mg/dL (ref 0.50–1.10)
GFR calc Af Amer: >90 mL/min (ref >90)
GFR calc non Af Amer: >90 mL/min (ref >90)
Glucose, Bld: 99 mg/dL (ref 70–99)
POTASSIUM: 4 meq/L (ref 3.7–5.3)
SODIUM: 140 meq/L (ref 137–147)
TOTAL PROTEIN: 8 g/dL (ref 6.0–8.3)

## 2013-12-30 LAB — LIPASE, BLOOD: Lipase: 36 U/L (ref 11–59)

## 2013-12-30 NOTE — ED Notes (Signed)
Pt reports she has a hx of kidney stones that cause bilateral flank pain. Pt states for the past 3 days she has also been having dental pain. Pt states that she has been evaluated for her dental pain and given ABX and pain medication but it has not helped and now she is having increased flank pain. Pt a&o x4, NAD noted at this time

## 2013-12-30 NOTE — ED Notes (Signed)
Pt calledx2, no answer 

## 2014-01-05 ENCOUNTER — Encounter (HOSPITAL_COMMUNITY): Payer: Self-pay | Admitting: Emergency Medicine

## 2014-01-05 ENCOUNTER — Emergency Department (HOSPITAL_COMMUNITY)
Admission: EM | Admit: 2014-01-05 | Discharge: 2014-01-05 | Disposition: A | Discharge status: Home / Self Care | Payer: Medicaid Other | Attending: Emergency Medicine | Admitting: Emergency Medicine

## 2014-01-05 DIAGNOSIS — K0889 Other specified disorders of teeth and supporting structures: Secondary | ICD-10-CM

## 2014-01-05 DIAGNOSIS — Z79899 Other long term (current) drug therapy: Secondary | ICD-10-CM | POA: Insufficient documentation

## 2014-01-05 DIAGNOSIS — Z87442 Personal history of urinary calculi: Secondary | ICD-10-CM | POA: Insufficient documentation

## 2014-01-05 DIAGNOSIS — Z3202 Encounter for pregnancy test, result negative: Secondary | ICD-10-CM | POA: Insufficient documentation

## 2014-01-05 DIAGNOSIS — R109 Unspecified abdominal pain: Secondary | ICD-10-CM

## 2014-01-05 LAB — URINALYSIS, ROUTINE W REFLEX MICROSCOPIC
BILIRUBIN URINE: NEGATIVE
GLUCOSE, UA: NEGATIVE mg/dL
HGB URINE DIPSTICK: NEGATIVE
Ketones, ur: 15 mg/dL — AB
Leukocytes, UA: NEGATIVE
Nitrite: NEGATIVE
PH: 7.5 (ref 5.0–8.0)
Protein, ur: NEGATIVE mg/dL
SPECIFIC GRAVITY, URINE: 1.023 (ref 1.005–1.030)
Urobilinogen, UA: 0.2 mg/dL (ref 0.0–1.0)

## 2014-01-05 LAB — POCT I-STAT, CHEM 8
BUN: 20 mg/dL (ref 6–23)
CHLORIDE: 105 meq/L (ref 96–112)
CREATININE: 0.7 mg/dL (ref 0.50–1.10)
Calcium, Ion: 1.2 mmol/L (ref 1.12–1.23)
Glucose, Bld: 96 mg/dL (ref 70–99)
HCT: 37 % (ref 36.0–46.0)
HEMOGLOBIN: 12.6 g/dL (ref 12.0–15.0)
POTASSIUM: 3.8 meq/L (ref 3.7–5.3)
SODIUM: 143 meq/L (ref 137–147)
TCO2: 26 mmol/L (ref 0–100)

## 2014-01-05 LAB — PREGNANCY, URINE: Preg Test, Ur: NEGATIVE

## 2014-01-05 MED ORDER — KETOROLAC TROMETHAMINE 60 MG/2ML IM SOLN
60.0000 mg | Freq: Once | INTRAMUSCULAR | Status: AC
  Administered 2014-01-05: 60 mg via INTRAMUSCULAR
  Filled 2014-01-05: qty 2

## 2014-01-05 MED ORDER — CYCLOBENZAPRINE HCL 10 MG PO TABS: 10.0000 mg | ORAL_TABLET | Freq: Two times a day (BID) | ORAL | Status: DC | PRN

## 2014-01-05 MED ORDER — ONDANSETRON 8 MG PO TBDP
8.0000 mg | ORAL_TABLET | Freq: Once | ORAL | Status: AC
  Administered 2014-01-05: 8 mg via ORAL
  Filled 2014-01-05: qty 1

## 2014-01-05 NOTE — ED Provider Notes (Signed)
CSN: [id number]     Arrival date & time 01/05/14  1755 History   First MD Initiated Contact with Patient 01/05/14 1848     Chief Complaint  Patient presents with  . Dental Pain  . Flank Pain     (Consider location/radiation/quality/duration/timing/severity/associated sxs/prior Treatment) HPI Comments: Patient is a 48 yo F PMHx significant for hx of kidney stones presenting to the ED for two complaints. Patient's first complaint is two weeks of left sided upper dental pain. Patient states she has been taking pain medication and antibiotics prescribed to her by her dentist with a followup appointment beginning of next week. She states she finished her course has been taking Advil with some improvement of her pain. She denies any drainage or bleeding from the tooth. Patient's second complaint is intermittent left-sided flank pain without radiation. Patient states it began a few days ago. Patient denies any alleviating factors. Patient states this feels like previous kidney stones in the past. She denies any associated dysuria, urinary frequency or urgency, decreased urine, vaginal bleeding or discharge. Patient's only abdominal surgical history includes appendectomy.  Patient is a 48 y.o. female presenting with tooth pain and flank pain.  Dental Pain Associated symptoms: no fever   Flank Pain Pertinent negatives include no abdominal pain, chest pain, chills, fever, nausea or vomiting.    Past Medical History  Diagnosis Date  . Renal disorder     kidney stones  . Kidney stones   . Kidney stones    Past Surgical History  Procedure Laterality Date  . Appendectomy     Family History  Problem Relation Age of Onset  . Diabetes Mother   . Diabetes Father    History  Substance Use Topics  . Smoking status: Never Smoker   . Smokeless tobacco: Not on file  . Alcohol Use: No   OB History   Grav Para Term Preterm Abortions TAB SAB Ect Mult Living                 Review of Systems    Constitutional: Negative for fever and chills.  Respiratory: Negative for shortness of breath.   Cardiovascular: Negative for chest pain.  Gastrointestinal: Negative for nausea, vomiting, abdominal pain and diarrhea.  Genitourinary: Positive for flank pain. Negative for dysuria, urgency, frequency, hematuria, decreased urine volume, vaginal bleeding, vaginal discharge, enuresis, difficulty urinating, genital sores, vaginal pain, menstrual problem, pelvic pain and dyspareunia.  All other systems reviewed and are negative.      Allergies  Review of patient's allergies indicates no known allergies.  Home Medications   Current Outpatient Rx  Name  Route  Sig  Dispense  Refill  . ibuprofen (ADVIL,MOTRIN) 800 MG tablet   Oral   Take 1 tablet (800 mg total) by mouth 3 (three) times daily.   21 tablet   0   . cyclobenzaprine (FLEXERIL) 10 MG tablet   Oral   Take 1 tablet (10 mg total) by mouth 2 (two) times daily as needed for muscle spasms.   10 tablet   0    BP 115/73  Pulse 80  Temp(Src) 97.8 F (36.6 C) (Oral)  Resp 18  SpO2 96%  LMP 12/30/2013 Physical Exam  Constitutional: She is oriented to person, place, and time. She appears well-developed and well-nourished.  Non-toxic appearance. She does not have a sickly appearance. She does not appear ill. No distress.  HENT:  Head: Normocephalic and atraumatic.  Right Ear: External ear normal.  Left Ear: External  ear normal.  Nose: Nose normal.  Mouth/Throat: Uvula is midline, oropharynx is clear and moist and mucous membranes are normal. No trismus in the jaw. Abnormal dentition. No uvula swelling. No tonsillar abscesses.    Broken crown to tooth noted on graphical documentation.   Eyes: Conjunctivae are normal.  Neck: Normal range of motion. Neck supple.  Cardiovascular: Normal rate, regular rhythm and normal heart sounds.   Pulmonary/Chest: Effort normal and breath sounds normal. No respiratory distress.  Abdominal:  Soft. Bowel sounds are normal. She exhibits no distension. There is no tenderness. There is no rigidity, no rebound, no guarding and no CVA tenderness.  Musculoskeletal: Normal range of motion.  Lymphadenopathy:    She has no cervical adenopathy.  Neurological: She is alert and oriented to person, place, and time.  Skin: Skin is warm and dry. She is not diaphoretic.  Psychiatric: She has a normal mood and affect.    ED Course  Procedures (including critical care time) Medications  ketorolac (TORADOL) injection 60 mg (60 mg Intramuscular Given 01/05/14 2029)  ondansetron (ZOFRAN-ODT) disintegrating tablet 8 mg (8 mg Oral Given 01/05/14 2029)    Labs Review Labs Reviewed  URINALYSIS, ROUTINE W REFLEX MICROSCOPIC - Abnormal; Notable for the following:    Ketones, ur 15 (*)    All other components within normal limits  PREGNANCY, URINE  POCT I-STAT, CHEM 8   Imaging Review No results found.  EKG Interpretation   None       MDM   Final diagnoses:  Pain, dental  Left flank pain    Filed Vitals:   01/05/14 1818  BP: 115/73  Pulse: 80  Temp: 97.8 F (36.6 C)  Resp: 18   Afebrile, NAD, non-toxic non-ill appearing, AAOx4.    1) Dental Pain: Patient with toothache.  No gross abscess.  Exam unconcerning for Ludwig's angina or spread of infection. Patient is already being treated by dentist with pain medications and antibiotics. Advised to continue those medications until seen at follow up appointment or follow up sooner if possible.  2) L sided flank pain: Abdomen soft, non-tender, non-distended. No guarding, rigidity, or rebound. No CVA tenderness. Labs reviewed. UA reviewed. No evidence of UTI, pyelonephritis or kidney stone on UA with benign examination warranting further imaging. Pain likely musculoskeletal in nature, advised NSAIDs with muscle relaxant.   Return precautions discussed. Patient is agreeable to plan. Patient is stable at time of  discharge      Jeannetta EllisJennifer L Kareema Keitt, PA-C 01/06/14 0036

## 2014-01-05 NOTE — Discharge Instructions (Signed)
Please follow up with your primary care physician in 1-2 days. If you do not have one please call the [facility] number listed above. Please follow up with your dentist at your scheduled follow up appointment. Please call your dentist for further pain prescriptions if necessary. Please use muscle relaxant as prescribed for back pain. Please finish your antibiotic course prescribed to you by your dentist. Please do not drive on this medication as it may make you drowsy. Please read all discharge instructions and return precautions.   Back Pain, Adult Low back pain is very common. About 1 in 5 people have back pain.The cause of low back pain is rarely dangerous. The pain often gets better over time.About half of people with a sudden onset of back pain feel better in just 2 weeks. About 8 in 10 people feel better by 6 weeks.  CAUSES Some common causes of back pain include:  Strain of the muscles or ligaments supporting the spine.  Wear and tear (degeneration) of the spinal discs.  Arthritis.  Direct injury to the back. DIAGNOSIS Most of the time, the direct cause of low back pain is not known.However, back pain can be treated effectively even when the exact cause of the pain is unknown.Answering your caregiver's questions about your overall health and symptoms is one of the most accurate ways to make sure the cause of your pain is not dangerous. If your caregiver needs more information, he or she may order lab work or imaging tests (X-rays or MRIs).However, even if imaging tests show changes in your back, this usually does not require surgery. HOME CARE INSTRUCTIONS For many people, back pain returns.Since low back pain is rarely dangerous, it is often a condition that people can learn to [facility]on their own.   Remain active. It is stressful on the back to sit or stand in one place. Do not sit, drive, or stand in one place for more than 30 minutes at a time. Take short  walks on level surfaces as soon as pain allows.Try to increase the length of time you walk each day.  Do not stay in bed.Resting more than 1 or 2 days can delay your recovery.  Do not avoid exercise or work.Your body is made to move.It is not dangerous to be active, even though your back may hurt.Your back will likely heal faster if you return to being active before your pain is gone.  Pay attention to your body when you bend and lift. Many people have less discomfortwhen lifting if they bend their knees, keep the load close to their bodies,and avoid twisting. Often, the most comfortable positions are those that put less stress on your recovering back.  Find a comfortable position to sleep. Use a firm mattress and lie on your side with your knees slightly bent. If you lie on your back, put a pillow under your knees.  Only take over-the-counter or prescription medicines as directed by your caregiver. Over-the-counter medicines to reduce pain and inflammation are often the most helpful.Your caregiver may prescribe muscle relaxant drugs.These medicines help dull your pain so you can more quickly return to your normal activities and healthy exercise.  Put ice on the injured area.  Put ice in a plastic bag.  Place a towel between your skin and the bag.  Leave the ice on for 15-20 minutes, 03-04 times a day for the first 2 to 3 days. After that, ice and heat may be alternated to reduce pain  and spasms.  Ask your caregiver about trying back exercises and gentle massage. This may be of some benefit.  Avoid feeling anxious or stressed.Stress increases muscle tension and can worsen back pain.It is important to recognize when you are anxious or stressed and learn ways to manage it.Exercise is a great option. SEEK MEDICAL CARE IF:  You have pain that is not relieved with rest or medicine.  You have pain that does not improve in 1 week.  You have new symptoms.  You are generally not  feeling well. SEEK IMMEDIATE MEDICAL CARE IF:   You have pain that radiates from your back into your legs.  You develop new bowel or bladder control problems.  You have unusual weakness or numbness in your arms or legs.  You develop nausea or vomiting.  You develop abdominal pain.  You feel faint. Document Released: 11/07/2005 Document Revised: 05/08/2012 Document Reviewed: 03/28/2011 [facility] Patient Information 2014 [place], [place].  Dental Pain A tooth ache may be caused by cavities (tooth decay). Cavities expose the nerve of the tooth to air and hot or cold temperatures. It may come from an infection or abscess (also called a boil or furuncle) around your tooth. It is also often caused by dental caries (tooth decay). This causes the pain you are having. DIAGNOSIS  Your caregiver can diagnose this problem by exam. TREATMENT   If caused by an infection, it may be treated with medications which kill germs (antibiotics) and pain medications as prescribed by your caregiver. Take medications as directed.  Only take over-the-counter or prescription medicines for pain, discomfort, or fever as directed by your caregiver.  Whether the tooth ache today is caused by infection or dental disease, you should see your dentist as soon as possible for further care. SEEK MEDICAL CARE IF: The exam and treatment you received today has been provided on an emergency basis only. This is not a substitute for complete medical or dental care. If your problem worsens or new problems (symptoms) appear, and you are unable to meet with your dentist, call or return to this location. SEEK IMMEDIATE MEDICAL CARE IF:   You have a fever.  You develop redness and swelling of your face, jaw, or neck.  You are unable to open your mouth.  You have severe pain uncontrolled by pain medicine. MAKE SURE YOU:   Understand these instructions.  Will watch your condition.  Will get help right away if you are not  doing well or get worse. Document Released: 11/07/2005 Document Revised: 01/30/2012 Document Reviewed: 06/25/2008 [facility] Patient Information 2014 [place], [place].

## 2014-01-05 NOTE — ED Notes (Signed)
Pt from home c/o dental pain and left flank pain. She reports that she has taken antibiotics and pain medication for both w/o relief. She is scheduled to have crown placed in 2 weeks.

## 2014-01-05 NOTE — ED Notes (Signed)
Pt presents d/t pain in her left flank x3 days.  Pt reports hx of kidney stones.  Pt last had narcotic pain pill 3 days ago that was prescribed by her dentist.

## 2014-01-07 NOTE — ED Provider Notes (Signed)
Medical screening examination/treatment/procedure(s) were performed by non-physician practitioner and as supervising physician I was immediately available for consultation/collaboration.  EKG Interpretation   None         Gwyneth SproutWhitney Tomeeka Plaugher, MD 01/07/14 [phone]

## 2014-01-27 ENCOUNTER — Encounter (HOSPITAL_COMMUNITY): Payer: Self-pay | Admitting: Emergency Medicine

## 2014-01-27 ENCOUNTER — Emergency Department (HOSPITAL_COMMUNITY)
Admission: EM | Admit: 2014-01-27 | Discharge: 2014-01-28 | Disposition: A | Discharge status: Home / Self Care | Payer: Medicaid Other | Attending: Emergency Medicine | Admitting: Emergency Medicine

## 2014-01-27 DIAGNOSIS — S298XXA Other specified injuries of thorax, initial encounter: Secondary | ICD-10-CM | POA: Insufficient documentation

## 2014-01-27 DIAGNOSIS — S0993XA Unspecified injury of face, initial encounter: Secondary | ICD-10-CM | POA: Insufficient documentation

## 2014-01-27 DIAGNOSIS — Z87442 Personal history of urinary calculi: Secondary | ICD-10-CM | POA: Insufficient documentation

## 2014-01-27 DIAGNOSIS — R059 Cough, unspecified: Secondary | ICD-10-CM | POA: Insufficient documentation

## 2014-01-27 DIAGNOSIS — Y92410 Unspecified street and highway as the place of occurrence of the external cause: Secondary | ICD-10-CM | POA: Insufficient documentation

## 2014-01-27 DIAGNOSIS — Y9389 Activity, other specified: Secondary | ICD-10-CM | POA: Insufficient documentation

## 2014-01-27 DIAGNOSIS — R1110 Vomiting, unspecified: Secondary | ICD-10-CM | POA: Insufficient documentation

## 2014-01-27 DIAGNOSIS — S199XXA Unspecified injury of neck, initial encounter: Principal | ICD-10-CM

## 2014-01-27 DIAGNOSIS — S3981XA Other specified injuries of abdomen, initial encounter: Secondary | ICD-10-CM | POA: Insufficient documentation

## 2014-01-27 DIAGNOSIS — Z79899 Other long term (current) drug therapy: Secondary | ICD-10-CM | POA: Insufficient documentation

## 2014-01-27 DIAGNOSIS — R05 Cough: Secondary | ICD-10-CM | POA: Insufficient documentation

## 2014-01-27 MED ORDER — KETOROLAC TROMETHAMINE 15 MG/ML IJ SOLN
30.0000 mg | Freq: Once | INTRAMUSCULAR | Status: AC
  Administered 2014-01-27: 30 mg via INTRAMUSCULAR
  Filled 2014-01-27: qty 2

## 2014-01-27 MED ORDER — CYCLOBENZAPRINE HCL 10 MG PO TABS
5.0000 mg | ORAL_TABLET | Freq: Once | ORAL | Status: AC
  Administered 2014-01-27: 5 mg via ORAL
  Filled 2014-01-27: qty 1
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
## 2014-01-28 MED ORDER — IBUPROFEN 800 MG PO TABS: 800.0000 mg | ORAL_TABLET | Freq: Three times a day (TID) | ORAL | Status: DC | PRN

## 2014-01-28 MED ORDER — CYCLOBENZAPRINE HCL 10 MG PO TABS: 10.0000 mg | ORAL_TABLET | Freq: Three times a day (TID) | ORAL | Status: DC
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
## 2014-01-28 NOTE — ED Provider Notes (Signed)
Medical screening examination/treatment/procedure(s) were performed by non-physician practitioner and as supervising physician I was immediately available for consultation/collaboration.   EKG Interpretation None        Lyanne CoKevin M Sereniti Wan, MD 01/28/14 [phone]

## 2014-02-13 ENCOUNTER — Encounter (HOSPITAL_BASED_OUTPATIENT_CLINIC_OR_DEPARTMENT_OTHER): Payer: Self-pay | Admitting: *Deleted

## 2014-02-13 NOTE — Progress Notes (Signed)
No labs needed-pt has been here [facility] yr [name]english is very good and daughter will be with her

## 2014-02-17 ENCOUNTER — Encounter (HOSPITAL_BASED_OUTPATIENT_CLINIC_OR_DEPARTMENT_OTHER): Payer: Medicaid Other | Admitting: Anesthesiology

## 2014-02-17 ENCOUNTER — Ambulatory Visit (HOSPITAL_BASED_OUTPATIENT_CLINIC_OR_DEPARTMENT_OTHER)
Admission: RE | Admit: 2014-02-17 | Discharge: 2014-02-17 | Disposition: A | Discharge status: Home / Self Care | Payer: Medicaid Other | Source: Ambulatory Visit | Attending: Specialist | Admitting: Specialist

## 2014-02-17 ENCOUNTER — Encounter (HOSPITAL_BASED_OUTPATIENT_CLINIC_OR_DEPARTMENT_OTHER)
Admission: RE | Disposition: A | Discharge status: Home / Self Care | Payer: Self-pay | Source: Ambulatory Visit | Attending: Specialist

## 2014-02-17 ENCOUNTER — Encounter (HOSPITAL_BASED_OUTPATIENT_CLINIC_OR_DEPARTMENT_OTHER): Payer: Self-pay | Admitting: Anesthesiology

## 2014-02-17 ENCOUNTER — Ambulatory Visit (HOSPITAL_BASED_OUTPATIENT_CLINIC_OR_DEPARTMENT_OTHER): Payer: Medicaid Other | Admitting: Anesthesiology

## 2014-02-17 DIAGNOSIS — L7211 Pilar cyst: Secondary | ICD-10-CM | POA: Insufficient documentation

## 2014-02-17 DIAGNOSIS — Z79899 Other long term (current) drug therapy: Secondary | ICD-10-CM | POA: Insufficient documentation

## 2014-02-17 HISTORY — PX: MASS EXCISION: SHX2000

## 2014-02-17 LAB — POCT HEMOGLOBIN-HEMACUE: HEMOGLOBIN: 14.3 g/dL (ref 12.0–15.0)

## 2014-02-17 SURGERY — EXCISION MASS
Anesthesia: General

## 2014-02-17 MED ORDER — HYDROMORPHONE HCL PF 1 MG/ML IJ SOLN
0.2500 mg | INTRAMUSCULAR | Status: DC | PRN
  Administered 2014-02-17 (×2): 0.5 mg via INTRAVENOUS

## 2014-02-17 MED ORDER — MIDAZOLAM HCL 2 MG/2ML IJ SOLN: 1.0000 mg | INTRAMUSCULAR | Status: DC | PRN

## 2014-02-17 MED ORDER — LIDOCAINE-EPINEPHRINE 0.5 %-1:200000 IJ SOLN
INTRAMUSCULAR | Status: DC | PRN
  Administered 2014-02-17: 10 mL

## 2014-02-17 MED ORDER — LIDOCAINE HCL (CARDIAC) 20 MG/ML IV SOLN
INTRAVENOUS | Status: DC | PRN
  Administered 2014-02-17: 40 mg via INTRAVENOUS

## 2014-02-17 MED ORDER — ONDANSETRON HCL 4 MG/2ML IJ SOLN
INTRAMUSCULAR | Status: DC | PRN
  Administered 2014-02-17: 4 mg via INTRAVENOUS

## 2014-02-17 MED ORDER — CEFAZOLIN SODIUM-DEXTROSE 2-3 GM-% IV SOLR
2.0000 g | INTRAVENOUS | Status: AC
  Administered 2014-02-17: 2 g via INTRAVENOUS

## 2014-02-17 MED ORDER — LACTATED RINGERS IV SOLN
INTRAVENOUS | Status: DC
  Administered 2014-02-17: 10:00:00 via INTRAVENOUS

## 2014-02-17 MED ORDER — ONDANSETRON HCL 4 MG/2ML IJ SOLN: 4.0000 mg | Freq: Once | INTRAMUSCULAR | Status: DC | PRN

## 2014-02-17 MED ORDER — LIDOCAINE-EPINEPHRINE 0.5 %-1:200000 IJ SOLN
INTRAMUSCULAR | Status: AC
  Filled 2014-02-17: qty 2

## 2014-02-17 MED ORDER — PROPOFOL 10 MG/ML IV BOLUS
INTRAVENOUS | Status: DC | PRN
  Administered 2014-02-17: 200 mg via INTRAVENOUS

## 2014-02-17 MED ORDER — OXYCODONE HCL 5 MG PO TABS
ORAL_TABLET | ORAL | Status: AC
  Filled 2014-02-17: qty 1

## 2014-02-17 MED ORDER — MIDAZOLAM HCL 2 MG/2ML IJ SOLN
INTRAMUSCULAR | Status: AC
  Filled 2014-02-17: qty 2

## 2014-02-17 MED ORDER — MIDAZOLAM HCL 5 MG/5ML IJ SOLN
INTRAMUSCULAR | Status: DC | PRN
  Administered 2014-02-17: 2 mg via INTRAVENOUS

## 2014-02-17 MED ORDER — OXYCODONE HCL 5 MG PO TABS
5.0000 mg | ORAL_TABLET | Freq: Once | ORAL | Status: AC | PRN
  Administered 2014-02-17: 5 mg via ORAL

## 2014-02-17 MED ORDER — HYDROMORPHONE HCL PF 1 MG/ML IJ SOLN
INTRAMUSCULAR | Status: AC
  Filled 2014-02-17: qty 1

## 2014-02-17 MED ORDER — FENTANYL CITRATE 0.05 MG/ML IJ SOLN
INTRAMUSCULAR | Status: AC
  Filled 2014-02-17: qty 4

## 2014-02-17 MED ORDER — FENTANYL CITRATE 0.05 MG/ML IJ SOLN
INTRAMUSCULAR | Status: DC | PRN
  Administered 2014-02-17: 2 ug via INTRAVENOUS

## 2014-02-17 MED ORDER — FENTANYL CITRATE 0.05 MG/ML IJ SOLN: 50.0000 ug | INTRAMUSCULAR | Status: DC | PRN

## 2014-02-17 MED ORDER — CEFAZOLIN SODIUM-DEXTROSE 2-3 GM-% IV SOLR
INTRAVENOUS | Status: AC
  Filled 2014-02-17: qty 50

## 2014-02-17 MED ORDER — OXYCODONE HCL 5 MG/5ML PO SOLN: 5.0000 mg | Freq: Once | ORAL | Status: AC | PRN

## 2014-02-17 MED ORDER — DEXAMETHASONE SODIUM PHOSPHATE 4 MG/ML IJ SOLN
INTRAMUSCULAR | Status: DC | PRN
  Administered 2014-02-17: 10 mg via INTRAVENOUS

## 2014-02-17 SURGICAL SUPPLY — 69 items
ADH SKN CLS APL DERMABOND .7 (GAUZE/BANDAGES/DRESSINGS) ×1
APL SKNCLS STERI-STRIP NONHPOA (GAUZE/BANDAGES/DRESSINGS)
BAG DECANTER FOR FLEXI CONT (MISCELLANEOUS) ×3 IMPLANT
BALL CTTN LRG ABS STRL LF (GAUZE/BANDAGES/DRESSINGS)
BANDAGE ELASTIC 4 VELCRO ST LF (GAUZE/BANDAGES/DRESSINGS) IMPLANT
BENZOIN TINCTURE PRP APPL 2/3 (GAUZE/BANDAGES/DRESSINGS) IMPLANT
BLADE KNIFE PERSONA 10 (BLADE) ×3 IMPLANT
BLADE KNIFE PERSONA 15 (BLADE) ×3 IMPLANT
BNDG COHESIVE 4X5 TAN STRL (GAUZE/BANDAGES/DRESSINGS) IMPLANT
BNDG GAUZE ELAST 4 BULKY (GAUZE/BANDAGES/DRESSINGS) IMPLANT
CANISTER SUCT 1200ML W/VALVE (MISCELLANEOUS) IMPLANT
CLEANER CAUTERY TIP 5X5 PAD (MISCELLANEOUS) ×1 IMPLANT
CLOSURE WOUND 1/2 X4 (GAUZE/BANDAGES/DRESSINGS)
COTTONBALL LRG STERILE PKG (GAUZE/BANDAGES/DRESSINGS) IMPLANT
COVER MAYO STAND STRL (DRAPES) ×3 IMPLANT
COVER TABLE BACK 60X90 (DRAPES) ×3 IMPLANT
DERMABOND ADVANCED (GAUZE/BANDAGES/DRESSINGS) ×2
DERMABOND ADVANCED .7 DNX12 (GAUZE/BANDAGES/DRESSINGS) IMPLANT
DRAPE LAPAROSCOPIC ABDOMINAL (DRAPES) IMPLANT
DRAPE U-SHAPE 76X120 STRL (DRAPES) ×2 IMPLANT
DRSG PAD ABDOMINAL 8X10 ST (GAUZE/BANDAGES/DRESSINGS) IMPLANT
ELECT NDL TIP 2.8 STRL (NEEDLE) ×1 IMPLANT
ELECT NEEDLE TIP 2.8 STRL (NEEDLE) ×3 IMPLANT
ELECT REM PT RETURN 9FT ADLT (ELECTROSURGICAL) ×3
ELECTRODE REM PT RTRN 9FT ADLT (ELECTROSURGICAL) ×1 IMPLANT
FILTER 7/8 IN (FILTER) IMPLANT
GAUZE SPONGE 4X4 16PLY XRAY LF (GAUZE/BANDAGES/DRESSINGS) IMPLANT
GAUZE XEROFORM 1X8 LF (GAUZE/BANDAGES/DRESSINGS) ×3 IMPLANT
GAUZE XEROFORM 5X9 LF (GAUZE/BANDAGES/DRESSINGS) IMPLANT
GLOVE BIO SURGEON STRL SZ 6.5 (GLOVE) ×2 IMPLANT
GLOVE BIO SURGEONS STRL SZ 6.5 (GLOVE) ×1
GLOVE BIOGEL M STRL SZ7.5 (GLOVE) ×3 IMPLANT
GLOVE BIOGEL PI IND STRL 8 (GLOVE) ×1 IMPLANT
GLOVE BIOGEL PI INDICATOR 8 (GLOVE) ×2
GLOVE ECLIPSE 7.0 STRL STRAW (GLOVE) ×3 IMPLANT
GOWN STRL REUS W/ TWL LRG LVL3 (GOWN DISPOSABLE) ×1 IMPLANT
GOWN STRL REUS W/ TWL XL LVL3 (GOWN DISPOSABLE) ×1 IMPLANT
GOWN STRL REUS W/TWL LRG LVL3 (GOWN DISPOSABLE) ×3
GOWN STRL REUS W/TWL XL LVL3 (GOWN DISPOSABLE) ×3
NDL HYPO 25X1 1.5 SAFETY (NEEDLE) ×1 IMPLANT
NEEDLE HYPO 25X1 1.5 SAFETY (NEEDLE) ×3 IMPLANT
PACK BASIN DAY SURGERY FS (CUSTOM PROCEDURE TRAY) ×3 IMPLANT
PAD CLEANER CAUTERY TIP 5X5 (MISCELLANEOUS) ×2
PENCIL BUTTON HOLSTER BLD 10FT (ELECTRODE) ×3 IMPLANT
SHEET MEDIUM DRAPE 40X70 STRL (DRAPES) ×3 IMPLANT
SHEETING SILICONE GEL EPI DERM (MISCELLANEOUS) IMPLANT
SLEEVE SCD COMPRESS KNEE MED (MISCELLANEOUS) ×3 IMPLANT
SPONGE GAUZE 4X4 12PLY (GAUZE/BANDAGES/DRESSINGS) IMPLANT
SPONGE GAUZE 4X4 12PLY STER LF (GAUZE/BANDAGES/DRESSINGS) IMPLANT
SPONGE LAP 18X18 X RAY DECT (DISPOSABLE) ×3 IMPLANT
STAPLER VISISTAT 35W (STAPLE) IMPLANT
STOCKINETTE 4X48 STRL (DRAPES) IMPLANT
STRIP CLOSURE SKIN 1/2X4 (GAUZE/BANDAGES/DRESSINGS) IMPLANT
SUCTION FRAZIER TIP 10 FR DISP (SUCTIONS) ×2 IMPLANT
SUT ETHILON 3 0 PS 1 (SUTURE) ×2 IMPLANT
SUT MNCRL AB 3-0 PS2 18 (SUTURE) IMPLANT
SUT PROLENE 4 0 P 3 18 (SUTURE) IMPLANT
SUT PROLENE 4 0 PS 2 18 (SUTURE) IMPLANT
SUT SILK 3 0 PS 1 (SUTURE) IMPLANT
SYR CONTROL 10ML LL (SYRINGE) ×3 IMPLANT
TAPE HYPAFIX 6 X30' (GAUZE/BANDAGES/DRESSINGS)
TAPE HYPAFIX 6X30 (GAUZE/BANDAGES/DRESSINGS) IMPLANT
TOWEL OR 17X24 6PK STRL BLUE (TOWEL DISPOSABLE) ×6 IMPLANT
TRAY DSU PREP LF (CUSTOM PROCEDURE TRAY) ×3 IMPLANT
TUBE CONNECTING 20'X1/4 (TUBING) ×1
TUBE CONNECTING 20X1/4 (TUBING) ×1 IMPLANT
UNDERPAD 30X30 INCONTINENT (UNDERPADS AND DIAPERS) ×2 IMPLANT
VAC PENCILS W/TUBING CLEAR (MISCELLANEOUS) IMPLANT
YANKAUER SUCT BULB TIP NO VENT (SUCTIONS) ×3 IMPLANT

## 2014-02-17 NOTE — Anesthesia Procedure Notes (Signed)
Procedure Name: LMA Insertion Performed by: Juliauna Stueve W Pre-anesthesia Checklist: Patient identified, Timeout performed, Emergency Drugs available, Suction available and Patient being monitored Patient Re-evaluated:Patient Re-evaluated prior to inductionOxygen Delivery Method: Circle system utilized Preoxygenation: Pre-oxygenation with 100% oxygen Intubation Type: IV induction Ventilation: Mask ventilation without difficulty LMA: LMA inserted LMA Size: 4.0 Number of attempts: 1 Placement Confirmation: positive ETCO2 Tube secured with: Tape Dental Injury: Teeth and Oropharynx as per pre-operative assessment      

## 2014-02-17 NOTE — Anesthesia Postprocedure Evaluation (Signed)
  Anesthesia Post-op Note  Patient: Brittany [name]  Procedure(s) Performed: Procedure(s): EXCISION MASS OF SCALP WITH FLAP CLOSURE (N/A)  Patient Location: PACU  Anesthesia Type:General  Level of Consciousness: awake, alert  and oriented  Airway and Oxygen Therapy: Patient Spontanous Breathing  Post-op Pain: mild  Post-op Assessment: Post-op Vital signs reviewed  Post-op Vital Signs: Reviewed  Complications: No apparent anesthesia complications

## 2014-02-17 NOTE — Discharge Instructions (Signed)
Activity (include date of return to work if known) As tolerated: NO showers NO driving No heavy activities  Diet:regular No restrictions:  Wound Care: Keep dressing clean & dry  Special Instructions: Call Doctor if any unusual problems occur such as pain, excessive Bleeding, unrelieved Nausea/vomiting, Fever &/or chills When lying down, keep head elevated on 2-3 pillows or back-rest  Follow-up appointment: Please call the office.  The patient received discharge instruction from:___________________________________________      Patient signature ________________________________________ / Date___________    Signature of individual providing instructions/ Date________________          Post Anesthesia Home Care Instructions  Activity: Get plenty of rest for the remainder of the day. A responsible adult should stay with you for 24 hours following the procedure.  For the next 24 hours, DO NOT: -Drive a car -Advertising copywriterperate machinery -Drink alcoholic beverages -Take any medication unless instructed by your physician -Make any legal decisions or sign important papers.  Meals: Start with liquid foods such as gelatin or soup. Progress to regular foods as tolerated. Avoid greasy, spicy, heavy foods. If nausea and/or vomiting occur, drink only clear liquids until the nausea and/or vomiting subsides. Call your physician if vomiting continues.  Special Instructions/Symptoms: Your throat may feel dry or sore from the anesthesia or the breathing tube placed in your throat during surgery. If this causes discomfort, gargle with warm salt water. The discomfort should disappear within 24 hours.

## 2014-02-17 NOTE — Transfer of Care (Signed)
Immediate Anesthesia Transfer of Care Note  Patient: Brittany [name]  Procedure(s) Performed: Procedure(s): EXCISION MASS OF SCALP WITH FLAP CLOSURE (N/A)  Patient Location: PACU  Anesthesia Type:General  Level of Consciousness: awake and sedated  Airway & Oxygen Therapy: Patient Spontanous Breathing and Patient connected to face mask oxygen  Post-op Assessment: Report given to PACU RN and Post -op Vital signs reviewed and stable  Post vital signs: Reviewed and stable  Complications: No apparent anesthesia complications

## 2014-02-17 NOTE — Anesthesia Preprocedure Evaluation (Signed)
Anesthesia Evaluation  Patient identified by MRN, date of birth, ID band Patient awake    Reviewed: Allergy & Precautions, H&P , NPO status , Patient's Chart, lab work & pertinent test results  Airway Mallampati: I TM Distance: >3 FB Neck ROM: Full    Dental  (+) Teeth Intact, Dental Advisory Given   Pulmonary  breath sounds clear to auscultation        Cardiovascular Rhythm:Regular Rate:Normal     Neuro/Psych    GI/Hepatic   Endo/Other    Renal/GU      Musculoskeletal   Abdominal   Peds  Hematology   Anesthesia Other Findings   Reproductive/Obstetrics                           Anesthesia Physical Anesthesia Plan  ASA: I  Anesthesia Plan: General   Post-op Pain Management:    Induction: Intravenous  Airway Management Planned: LMA  Additional Equipment:   Intra-op Plan:   Post-operative Plan: Extubation in OR  Informed Consent: I have reviewed the patients History and Physical, chart, labs and discussed the procedure including the risks, benefits and alternatives for the proposed anesthesia with the patient or authorized representative who has indicated his/her understanding and acceptance.   Dental advisory given  Plan Discussed with: CRNA, Anesthesiologist and Surgeon  Anesthesia Plan Comments:         Anesthesia Quick Evaluation  

## 2014-02-17 NOTE — Brief Op Note (Signed)
02/17/2014  10:34 AM  PATIENT:  Brittany [name]  48 y.o. female  PRE-OPERATIVE DIAGNOSIS:  mass of scalp  POST-OPERATIVE DIAGNOSIS:  mass of scalp  PROCEDURE:  Procedure(s): EXCISION MASS OF SCALP WITH FLAP CLOSURE (N/A)  SURGEON:  Surgeon(s) and Role:    * Louisa SecondGerald Wynonna Fitzhenry, MD - Primary  PHYSICIAN ASSISTANT:   ASSISTANTS: none   ANESTHESIA:   general  EBL:  Total I/O In: 700 [I.V.:700] Out: -   BLOOD ADMINISTERED:none  DRAINS: none   LOCAL MEDICATIONS USED:  XYLOCAINE   SPECIMEN:  Excision  DISPOSITION OF SPECIMEN:  PATHOLOGY  COUNTS:  YES  TOURNIQUET:  * No tourniquets in log *  DICTATION: .Other Dictation: Dictation Number [id number]  PLAN OF CARE: Discharge to home after PACU  PATIENT DISPOSITION:  PACU - hemodynamically stable.   Delay start of Pharmacological VTE agent (>24hrs) due to surgical blood loss or risk of bleeding: yes

## 2014-02-17 NOTE — H&P (Signed)
Brittany [name] is an 48 y.o. female.   Chief Complaint: Enlarging mass of the scalp HPI: Mass parietal scalp with encreased growth and discomfort  Past Medical History  Diagnosis Date  . Renal disorder     kidney stones  . Kidney stones   . Kidney stones     Past Surgical History  Procedure Laterality Date  . Appendectomy      Family History  Problem Relation Age of Onset  . Diabetes Mother   . Diabetes Father    Social History:  reports that she has never smoked. She does not have any smokeless tobacco history on file. She reports that she does not drink alcohol or use illicit drugs.  Allergies: No Known Allergies  Medications Prior to Admission  Medication Sig Dispense Refill  . cyclobenzaprine (FLEXERIL) 10 MG tablet Take 1 tablet (10 mg total) by mouth 3 (three) times daily.  10 tablet  0    Results for orders placed during the hospital encounter of 02/17/14 (from the past 48 hour(s))  POCT HEMOGLOBIN-HEMACUE     Status: None   Collection Time    02/17/14  9:36 AM      Result Value Ref Range   Hemoglobin 14.3  12.0 - 15.0 g/dL   No results found.  Review of Systems  Constitutional: Negative.   HENT: Negative.   Eyes: Negative.   Respiratory: Negative.   Cardiovascular: Negative.   Gastrointestinal: Negative.   Genitourinary: Negative.   Musculoskeletal: Negative.   Skin: Negative.   Neurological: Negative.   Endo/Heme/Allergies: Negative.   Psychiatric/Behavioral: Negative.     Blood pressure 110/75, pulse 72, temperature 98.3 F (36.8 C), temperature source Oral, resp. rate 20, height 5\' 4"  (1.626 m), weight 58.06 kg (128 lb), SpO2 100.00%. Physical Exam   Assessment/Plan Mass scalp for excision and flap closure of the defect  Jaiyana Canale L 02/17/2014, 9:39 AM

## 2014-02-18 ENCOUNTER — Encounter (HOSPITAL_BASED_OUTPATIENT_CLINIC_OR_DEPARTMENT_OTHER): Payer: Self-pay | Admitting: Specialist

## 2014-02-18 NOTE — Op Note (Signed)
NAMLeim [name]:  [name], Brittany              ACCOUNT NO.:  [id number]  MEDICAL RECORD NO.:  [id number]  LOCATION:                                 FACILITY:  PHYSICIAN:  Brittany [name], M.D.DATE OF BIRTH:  October 19, 1966  DATE OF PROCEDURE:  02/17/2014 DATE OF DISCHARGE:  02/17/2014                              OPERATIVE REPORT   A 48 year old lady on today, February 17, 2014, presents with a mass involving her parietal scalp, increased growth and discomfort, and measured about 2 x 2.5 cm.  Procedure was done an excision of area, rotational flap coverage with defect.  Anesthesia is general supplemented by local anesthesia, 0.5% Xylocaine with epinephrine, 1:200,000 concentration, total of 20 mL.  The patient was taken to the operating room, placed on the operating room table in the supine position, was given adequate general anesthesia, and intubated orally.  Prep was done to the parietal scalp area after little bit hair was shaved with razor.  Prep was done with Betadine solution, walled off with sterile towels and drapes so as to make a sterile field.  A 0.5% Xylocaine with epinephrine was injected for local anesthesia and vasoconstriction.  Curvilinear incision was made across the mass and the mass removed entirely using sharp and blunt dissection, removing midportion of it with the skin.  The defect, which measured approximately 4 x 4.5 cm, was then closed with a rotational flap coverage.  Make an angle at approximately 120 degrees. Subcutaneous closure was done with 3-0 Monocryl x2 layers and then running 3-0 nylon.  The wounds were cleansed with sterile compression garment, dressings.  She tolerated the procedures very well, was taken to the recovery room in excellent condition.     Yaakov GuthrieGerald L. Shon [name], M.D.   ______________________________ Yaakov GuthrieGerald L. Shon [name], M.D.    Cathie HoopsGLT/MEDQ  D:  02/17/2014  T:  02/17/2014  Job:  [id number]

## 2014-03-05 ENCOUNTER — Encounter: Payer: Self-pay | Admitting: Advanced Practice Midwife

## 2014-04-01 ENCOUNTER — Ambulatory Visit: Payer: Medicaid Other | Admitting: Advanced Practice Midwife

## 2014-04-02 ENCOUNTER — Other Ambulatory Visit (HOSPITAL_COMMUNITY): Payer: Self-pay | Admitting: Internal Medicine

## 2014-04-02 DIAGNOSIS — Z1231 Encounter for screening mammogram for malignant neoplasm of breast: Secondary | ICD-10-CM

## 2014-04-04 ENCOUNTER — Ambulatory Visit (HOSPITAL_COMMUNITY)
Admission: RE | Admit: 2014-04-04 | Discharge: 2014-04-04 | Disposition: A | Discharge status: Home / Self Care | Payer: Medicaid Other | Source: Ambulatory Visit | Attending: Internal Medicine | Admitting: Internal Medicine

## 2014-04-04 DIAGNOSIS — Z1231 Encounter for screening mammogram for malignant neoplasm of breast: Secondary | ICD-10-CM | POA: Insufficient documentation

## 2014-09-26 ENCOUNTER — Emergency Department (HOSPITAL_COMMUNITY)
Admission: EM | Admit: 2014-09-26 | Discharge: 2014-09-27 | Disposition: A | Discharge status: Home / Self Care | Payer: Medicaid Other | Attending: Emergency Medicine | Admitting: Emergency Medicine

## 2014-09-26 ENCOUNTER — Encounter (HOSPITAL_COMMUNITY): Payer: Self-pay | Admitting: Emergency Medicine

## 2014-09-26 DIAGNOSIS — Z3202 Encounter for pregnancy test, result negative: Secondary | ICD-10-CM | POA: Insufficient documentation

## 2014-09-26 DIAGNOSIS — Z79899 Other long term (current) drug therapy: Secondary | ICD-10-CM | POA: Insufficient documentation

## 2014-09-26 DIAGNOSIS — N200 Calculus of kidney: Secondary | ICD-10-CM

## 2014-09-26 DIAGNOSIS — R109 Unspecified abdominal pain: Secondary | ICD-10-CM

## 2014-09-26 MED ORDER — MORPHINE SULFATE 4 MG/ML IJ SOLN
4.0000 mg | Freq: Once | INTRAMUSCULAR | Status: AC
  Administered 2014-09-27: 4 mg via INTRAVENOUS
  Filled 2014-09-26: qty 1

## 2014-09-26 MED ORDER — KETOROLAC TROMETHAMINE 30 MG/ML IJ SOLN
30.0000 mg | Freq: Once | INTRAMUSCULAR | Status: AC
  Administered 2014-09-27: 30 mg via INTRAVENOUS
  Filled 2014-09-26: qty 1

## 2014-09-26 MED ORDER — SODIUM CHLORIDE 0.9 % IV BOLUS (SEPSIS)
1000.0000 mL | Freq: Once | INTRAVENOUS | Status: AC
Start: 2014-09-27 — End: 2014-09-27
  Administered 2014-09-27: 1000 mL via INTRAVENOUS

## 2014-09-26 NOTE — ED Notes (Signed)
Pt arrived to the ED with a compliant of left sided flank pain.  Pt was seen previously for a cough when she took the cough medicine she began to experience left sided flank pain.  Pt states she has a history of kidney stones.  Pt states that pain is sharp and comes around to her pelvis

## 2014-09-27 ENCOUNTER — Emergency Department (HOSPITAL_COMMUNITY): Payer: Medicaid Other

## 2014-09-27 LAB — CBC WITH DIFFERENTIAL/PLATELET
BASOS PCT: 0 % (ref 0–1)
Basophils Absolute: 0 10*3/uL (ref 0.0–0.1)
Eosinophils Absolute: 0 10*3/uL (ref 0.0–0.7)
Eosinophils Relative: 0 % (ref 0–5)
HCT: 38.4 % (ref 36.0–46.0)
Hemoglobin: 13.5 g/dL (ref 12.0–15.0)
LYMPHS ABS: 1.1 10*3/uL (ref 0.7–4.0)
Lymphocytes Relative: 15 % (ref 12–46)
MCH: 29.6 pg (ref 26.0–34.0)
MCHC: 35.2 g/dL (ref 30.0–36.0)
MCV: 84.2 fL (ref 78.0–100.0)
MONOS PCT: 4 % (ref 3–12)
Monocytes Absolute: 0.3 10*3/uL (ref 0.1–1.0)
NEUTROS ABS: 6.1 10*3/uL (ref 1.7–7.7)
NEUTROS PCT: 81 % — AB (ref 43–77)
PLATELETS: 172 10*3/uL (ref 150–400)
RBC: 4.56 MIL/uL (ref 3.87–5.11)
RDW: 13.7 % (ref 11.5–15.5)
WBC: 7.5 10*3/uL (ref 4.0–10.5)

## 2014-09-27 LAB — URINALYSIS, ROUTINE W REFLEX MICROSCOPIC
BILIRUBIN URINE: NEGATIVE
GLUCOSE, UA: NEGATIVE mg/dL
Ketones, ur: 40 mg/dL — AB
Nitrite: NEGATIVE
PH: 5.5 (ref 5.0–8.0)
Protein, ur: NEGATIVE mg/dL
SPECIFIC GRAVITY, URINE: 1.02 (ref 1.005–1.030)
Urobilinogen, UA: 0.2 mg/dL (ref 0.0–1.0)

## 2014-09-27 LAB — COMPREHENSIVE METABOLIC PANEL
ALT: 9 U/L (ref 0–35)
ANION GAP: 15 (ref 5–15)
AST: 14 U/L (ref 0–37)
Albumin: 4.3 g/dL (ref 3.5–5.2)
Alkaline Phosphatase: 72 U/L (ref 39–117)
BILIRUBIN TOTAL: 0.6 mg/dL (ref 0.3–1.2)
BUN: 13 mg/dL (ref 6–23)
CHLORIDE: 100 meq/L (ref 96–112)
CO2: 24 mEq/L (ref 19–32)
Calcium: 9.5 mg/dL (ref 8.4–10.5)
Creatinine, Ser: 0.66 mg/dL (ref 0.50–1.10)
GFR calc Af Amer: >90 mL/min (ref >90)
GFR calc non Af Amer: >90 mL/min (ref >90)
Glucose, Bld: 107 mg/dL — ABNORMAL HIGH (ref 70–99)
POTASSIUM: 3.7 meq/L (ref 3.7–5.3)
SODIUM: 139 meq/L (ref 137–147)
Total Protein: 8.4 g/dL — ABNORMAL HIGH (ref 6.0–8.3)

## 2014-09-27 LAB — URINE MICROSCOPIC-ADD ON

## 2014-09-27 LAB — PREGNANCY, URINE: Preg Test, Ur: NEGATIVE

## 2014-09-27 LAB — LIPASE, BLOOD: Lipase: 21 U/L (ref 11–59)

## 2014-09-27 MED ORDER — HYDROCODONE-ACETAMINOPHEN 5-325 MG PO TABS: 2.0000 | ORAL_TABLET | Freq: Two times a day (BID) | ORAL | Status: DC | PRN

## 2014-09-27 MED ORDER — CEPHALEXIN 500 MG PO CAPS: 500.0000 mg | ORAL_CAPSULE | Freq: Two times a day (BID) | ORAL | Status: DC

## 2014-09-27 MED ORDER — ONDANSETRON 4 MG PO TBDP: 4.0000 mg | ORAL_TABLET | Freq: Three times a day (TID) | ORAL | Status: DC | PRN

## 2014-09-27 NOTE — ED Provider Notes (Signed)
CSN: [id number]     Arrival date & time 09/26/14  2210 History   First MD Initiated Contact with Patient 09/26/14 2338     Chief Complaint  Patient presents with  . Cough  . Flank Pain     (Consider location/radiation/quality/duration/timing/severity/associated sxs/prior Treatment) HPI  Brittany [name] is a [age] y.o. female with past medical history of nephrolithiasis coming in with left flank pain. Patient states this occurred around 1 PM today, sudden onset left flank pain. Throbbing in nature with intermittent stabbing sensation. She's had nausea with no vomiting. The pain does not radiate to her abdomen. She denies dysuria or hematuria. Patient states she's not had an appetite today. This and the setting of a viral URI with rhinorrhea and nonproductive cough. Patient has been taking NyQuil. She's denying any chest pain or shortness of breath. She states this is consistent with her prior episodes of nephrolithiasis and is requesting pain control.  Patient has no further complaints.    10 Systems reviewed and are negative for acute change except as noted in the HPI.    Past Medical History  Diagnosis Date  . Renal disorder     kidney stones  . Kidney stones   . Kidney stones    Past Surgical History  Procedure Laterality Date  . Appendectomy    . Mass excision N/A 02/17/2014    Procedure: EXCISION MASS OF SCALP WITH FLAP CLOSURE;  Surgeon: Louisa SecondGerald Truesdale, MD;  Location: [facility];  Service: Plastics;  Laterality: N/A;   Family History  Problem Relation Age of Onset  . Diabetes Mother   . Diabetes Father    History  Substance Use Topics  . Smoking status: Never Smoker   . Smokeless tobacco: Not on file  . Alcohol Use: No   OB History    No data available     Review of Systems    Allergies  Review of patient's allergies indicates no known allergies.  Home Medications   Prior to Admission medications   Medication Sig Start Date End Date Taking?  Authorizing Provider  DM-Doxylamine-Acetaminophen (NYQUIL COLD & FLU PO) Take 30 mLs by mouth 2 (two) times daily as needed. cough   Yes Historical Provider, MD  cyclobenzaprine (FLEXERIL) 10 MG tablet Take 1 tablet (10 mg total) by mouth 3 (three) times daily. 01/28/14   Arman FilterGail K Schulz, NP   BP 101/63 mmHg  Pulse 99  Temp(Src) 98.6 F (37 C) (Oral)  Resp 18  Ht 5\' 7"  (1.702 m)  Wt 127 lb (57.607 kg)  BMI 19.89 kg/m2  SpO2 97% Physical Exam  Constitutional: She is oriented to person, place, and time. She appears well-developed and well-nourished. No distress.  HENT:  Head: Normocephalic and atraumatic.  Nose: Nose normal.  Mouth/Throat: Oropharynx is clear and moist. No oropharyngeal exudate.  Eyes: Conjunctivae and EOM are normal. Pupils are equal, round, and reactive to light. No scleral icterus.  Neck: Normal range of motion. Neck supple. No JVD present. No tracheal deviation present. No thyromegaly present.  Cardiovascular: Normal rate, regular rhythm and normal heart sounds.  Exam reveals no gallop and no friction rub.   No murmur heard. Pulmonary/Chest: Effort normal and breath sounds normal. No respiratory distress. She has no wheezes. She exhibits no tenderness.  Abdominal: Soft. Bowel sounds are normal. She exhibits no distension and no mass. There is tenderness. There is no rebound and no guarding.  No CVA tenderness bilaterally. Mild right upper quadrant tenderness to palpation.negative Eulah PontMurphy  sign.  Musculoskeletal: Normal range of motion. She exhibits no edema or tenderness.  Lymphadenopathy:    She has no cervical adenopathy.  Neurological: She is alert and oriented to person, place, and time. No cranial nerve deficit. She exhibits normal muscle tone.  Skin: Skin is warm and dry. No rash noted. She is not diaphoretic. No erythema. No pallor.  Nursing note and vitals reviewed.   ED Course  Procedures (including critical care time) Labs Review Labs Reviewed  CBC WITH  DIFFERENTIAL - Abnormal; Notable for the following:    Neutrophils Relative % 81 (*)    All other components within normal limits  COMPREHENSIVE METABOLIC PANEL - Abnormal; Notable for the following:    Glucose, Bld 107 (*)    Total Protein 8.4 (*)    All other components within normal limits  URINALYSIS, ROUTINE W REFLEX MICROSCOPIC - Abnormal; Notable for the following:    APPearance CLOUDY (*)    Hgb urine dipstick MODERATE (*)    Ketones, ur 40 (*)    Leukocytes, UA SMALL (*)    All other components within normal limits  URINE MICROSCOPIC-ADD ON - Abnormal; Notable for the following:    Bacteria, UA FEW (*)    All other components within normal limits  LIPASE, BLOOD  PREGNANCY, URINE    Imaging Review [place] Abdomen Complete  09/27/2014   CLINICAL DATA:  Abdominal pain.  History kidney stones.  EXAM: ULTRASOUND ABDOMEN COMPLETE  COMPARISON:  CT abdomen and pelvis 03/17/2013  FINDINGS: Gallbladder: No gallstones or wall thickening visualized. No sonographic Murphy sign noted.  Common bile duct: Diameter: 1.5 cm, normal  Liver: No focal lesion identified. Within normal limits in parenchymal echogenicity.  IVC: No abnormality visualized.  Pancreas: Visualized portion unremarkable.  Spleen: Size and appearance within normal limits.  Right Kidney: Length: 10.4 cm. Small hyperechoic focus demonstrated in the upper pole measuring about 1 cm diameter. Appearance suggests an angiomyolipoma. This is of demonstrated on previous CT scan. No hydronephrosis in the right kidney.  Left Kidney: Length: 9.9 cm. Echogenicity within normal limits. No mass or hydronephrosis visualized.  Abdominal aorta: No aneurysm visualized.  Other findings: None.  IMPRESSION: No acute abnormalities demonstrated. 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   Electronically Signed   By: Burman NievesWilliam  Stevens M.D.   On: 09/27/2014 01:10     EKG Interpretation None      MDM   Final diagnoses:  Abdominal pain    Patient  presents emergency department out of concern for left flank pain. She states is consistent with her prior kidney stones. Will obtain urinalysis and renal ultrasound to evaluate for hydronephrosis. She was given morphine and Toradol the emergency department for pain relief.  Upon my repeat assessment, the patient's pain is significantly improved. Ultrasound does not reveal any hydronephrosis or pathology in the right upper quadrant. Urinalysis reveals possibly a mild UTI with leukocytes and bacteria. We'll treat with a short three-day course of antibiotics. Urology follow-up was recommended. Her vital signs remain within her normal limits and she is safe for discharge.   Tomasita CrumbleAdeleke Lonie Rummell, MD 09/27/14 [phone]

## 2014-09-27 NOTE — Discharge Instructions (Signed)
Kidney Stones Brittany [name], You were seen today for abdominal pain. This is likely your kidney stone. Your urine showed a mild infection. Take antibiotics for 3 days. Follow-up with urology for continued treatment. If any symptoms worsen come back to emergency department immediately for repeat evaluation. Thank you. Kidney stones (urolithiasis) are solid masses that form inside your kidneys. The intense pain is caused by the stone moving through the kidney, ureter, bladder, and urethra (urinary tract). When the stone moves, the ureter starts to spasm around the stone. The stone is usually passed in your pee (urine).  HOME CARE  Drink enough fluids to keep your pee clear or pale yellow. This helps to get the stone out.  Strain all pee through the provided strainer. Do not pee without peeing through the strainer, not even once. If you pee the stone out, catch it in the strainer. The stone may be as small as a grain of salt. Take this to your doctor. This will help your doctor figure out what you can do to try to prevent more kidney stones.  Only take medicine as told by your doctor.  Follow up with your doctor as told.  Get follow-up X-rays as told by your doctor. GET HELP IF: You have pain that gets worse even if you have been taking pain medicine. GET HELP RIGHT AWAY IF:   Your pain does not get better with medicine.  You have a fever or shaking chills.  Your pain increases and gets worse over 18 hours.  You have new belly (abdominal) pain.  You feel faint or pass out.  You are unable to pee. MAKE SURE YOU:   Understand these instructions.  Will watch your condition.  Will get help right away if you are not doing well or get worse. Document Released: 04/25/2008 Document Revised: 07/10/2013 Document Reviewed: 04/10/2013 [facility] Patient Information 2015 [employer] [place]. This information is not intended to replace advice given to you by your health care provider. Make sure you  discuss any questions you have with your health care provider.

## 2014-09-27 NOTE — ED Notes (Signed)
Pt aware of needed urine 

## 2016-01-14 ENCOUNTER — Emergency Department (HOSPITAL_COMMUNITY)
Admission: EM | Admit: 2016-01-14 | Discharge: 2016-01-15 | Disposition: A | Discharge status: Home / Self Care | Payer: Self-pay | Attending: Emergency Medicine | Admitting: Emergency Medicine

## 2016-01-14 ENCOUNTER — Encounter (HOSPITAL_COMMUNITY): Payer: Self-pay

## 2016-01-14 DIAGNOSIS — R110 Nausea: Secondary | ICD-10-CM | POA: Insufficient documentation

## 2016-01-14 DIAGNOSIS — Z9049 Acquired absence of other specified parts of digestive tract: Secondary | ICD-10-CM | POA: Insufficient documentation

## 2016-01-14 DIAGNOSIS — Z87442 Personal history of urinary calculi: Secondary | ICD-10-CM | POA: Insufficient documentation

## 2016-01-14 DIAGNOSIS — Z792 Long term (current) use of antibiotics: Secondary | ICD-10-CM | POA: Insufficient documentation

## 2016-01-14 DIAGNOSIS — Z79899 Other long term (current) drug therapy: Secondary | ICD-10-CM | POA: Insufficient documentation

## 2016-01-14 DIAGNOSIS — Z87448 Personal history of other diseases of urinary system: Secondary | ICD-10-CM | POA: Insufficient documentation

## 2016-01-14 DIAGNOSIS — R1011 Right upper quadrant pain: Secondary | ICD-10-CM

## 2016-01-14 NOTE — ED Notes (Signed)
Patient c/o RUQ pain intermittently x 3wks.  Patient states pain is sharp and pinching.  Patient denies nausea, vomiting, diarrhea, urinary symptoms.  Patient states she feels like right upper abdomen is swollen.

## 2016-01-15 ENCOUNTER — Emergency Department (HOSPITAL_COMMUNITY): Payer: Self-pay

## 2016-01-15 LAB — URINE MICROSCOPIC-ADD ON

## 2016-01-15 LAB — URINALYSIS, ROUTINE W REFLEX MICROSCOPIC
Bilirubin Urine: NEGATIVE
Glucose, UA: NEGATIVE mg/dL
Ketones, ur: NEGATIVE mg/dL
NITRITE: NEGATIVE
PROTEIN: NEGATIVE mg/dL
Specific Gravity, Urine: 1.025 (ref 1.005–1.030)
pH: 5.5 (ref 5.0–8.0)

## 2016-01-15 LAB — CBC
HCT: 38.3 % (ref 36.0–46.0)
Hemoglobin: 12.6 g/dL (ref 12.0–15.0)
MCH: 28.2 pg (ref 26.0–34.0)
MCHC: 32.9 g/dL (ref 30.0–36.0)
MCV: 85.7 fL (ref 78.0–100.0)
PLATELETS: 243 10*3/uL (ref 150–400)
RBC: 4.47 MIL/uL (ref 3.87–5.11)
RDW: 13.5 % (ref 11.5–15.5)
WBC: 6.8 10*3/uL (ref 4.0–10.5)

## 2016-01-15 LAB — COMPREHENSIVE METABOLIC PANEL
ALK PHOS: 54 U/L (ref 38–126)
ALT: 10 U/L — AB (ref 14–54)
AST: 15 U/L (ref 15–41)
Albumin: 4.7 g/dL (ref 3.5–5.0)
Anion gap: 8 (ref 5–15)
BUN: 16 mg/dL (ref 6–20)
CALCIUM: 9.5 mg/dL (ref 8.9–10.3)
CO2: 27 mmol/L (ref 22–32)
CREATININE: 0.66 mg/dL (ref 0.44–1.00)
Chloride: 107 mmol/L (ref 101–111)
Glucose, Bld: 103 mg/dL — ABNORMAL HIGH (ref 65–99)
Potassium: 3.6 mmol/L (ref 3.5–5.1)
Sodium: 142 mmol/L (ref 135–145)
TOTAL PROTEIN: 8.1 g/dL (ref 6.5–8.1)
Total Bilirubin: 0.4 mg/dL (ref 0.3–1.2)

## 2016-01-15 LAB — LIPASE, BLOOD: LIPASE: 37 U/L (ref 11–51)

## 2016-01-15 MED ORDER — ONDANSETRON HCL 4 MG/2ML IJ SOLN
4.0000 mg | Freq: Once | INTRAMUSCULAR | Status: AC
Start: 2016-01-15 — End: 2016-01-15
  Administered 2016-01-15: 4 mg via INTRAVENOUS
  Filled 2016-01-15: qty 2

## 2016-01-15 MED ORDER — HYDROCODONE-ACETAMINOPHEN 5-325 MG PO TABS: 1.0000 | ORAL_TABLET | Freq: Four times a day (QID) | ORAL | Status: DC | PRN

## 2016-01-15 MED ORDER — ONDANSETRON HCL 4 MG PO TABS: 4.0000 mg | ORAL_TABLET | Freq: Four times a day (QID) | ORAL | Status: DC

## 2016-01-15 MED ORDER — MORPHINE SULFATE (PF) 4 MG/ML IV SOLN: 4.0000 mg | Freq: Once | INTRAVENOUS | Status: DC

## 2016-01-15 MED ORDER — MORPHINE SULFATE (PF) 4 MG/ML IV SOLN
4.0000 mg | Freq: Once | INTRAVENOUS | Status: AC
  Administered 2016-01-15: 4 mg via INTRAVENOUS
  Filled 2016-01-15: qty 1

## 2016-01-15 NOTE — ED Provider Notes (Signed)
CSN: [id number]     Arrival date & time 01/14/16  2332 History   First MD Initiated Contact with Patient 01/15/16 0126     Chief Complaint  Patient presents with  . Abdominal Pain     (Consider location/radiation/quality/duration/timing/severity/associated sxs/prior Treatment) HPI Comments: Patient presents to the emergency department with chief complaint of right upper quadrant abdominal pain. She states that she has had the pain for the past 3 weeks. She states pain is intermittent. She describes it as a sharp pinching sensation. She reports some associated nausea, but denies any vomiting or diarrhea. She denies any dysuria or hematuria. She has not tried taking anything for her symptoms. She reports past surgical history remarkable for appendectomy.  The history is provided by the patient. No language interpreter was used.    Past Medical History  Diagnosis Date  . Renal disorder     kidney stones  . Kidney stones   . Kidney stones    Past Surgical History  Procedure Laterality Date  . Appendectomy    . Mass excision N/A 02/17/2014    Procedure: EXCISION MASS OF SCALP WITH FLAP CLOSURE;  Surgeon: Louisa Second, MD;  Location: [facility];  Service: Plastics;  Laterality: N/A;   Family History  Problem Relation Age of Onset  . Diabetes Mother   . Diabetes Father    Social History  Substance Use Topics  . Smoking status: Never Smoker   . Smokeless tobacco: None  . Alcohol Use: No   OB History    No data available     Review of Systems  All other systems reviewed and are negative.     Allergies  Review of patient's allergies indicates no known allergies.  Home Medications   Prior to Admission medications   Medication Sig Start Date End Date Taking? Authorizing Provider  cephALEXin (KEFLEX) 500 MG capsule Take 1 capsule (500 mg total) by mouth 2 (two) times daily. 09/27/14   Tomasita Crumble, MD  cyclobenzaprine (FLEXERIL) 10 MG tablet Take 1 tablet  (10 mg total) by mouth 3 (three) times daily. 01/28/14   Earley Favor, NP  DM-Doxylamine-Acetaminophen (NYQUIL COLD & FLU PO) Take 30 mLs by mouth 2 (two) times daily as needed. cough    Historical Provider, MD  HYDROcodone-acetaminophen (NORCO/VICODIN) 5-325 MG per tablet Take 2 tablets by mouth 2 (two) times daily as needed for severe pain. 09/27/14   Tomasita Crumble, MD  ondansetron (ZOFRAN ODT) 4 MG disintegrating tablet Take 1 tablet (4 mg total) by mouth every 8 (eight) hours as needed.  ODT q4 hours prn nausea/vomit 09/27/14   Adeleke Oni, MD   BP 123/85 mmHg  Pulse 67  Temp(Src) 98 F (36.7 C) (Oral)  Resp 16  SpO2 100% Physical Exam  Constitutional: She is oriented to person, place, and time. She appears well-developed and well-nourished.  HENT:  Head: Normocephalic and atraumatic.  Eyes: Conjunctivae and EOM are normal. Pupils are equal, round, and reactive to light.  Neck: Normal range of motion. Neck supple.  Cardiovascular: Normal rate and regular rhythm.  Exam reveals no gallop and no friction rub.   No murmur heard. Pulmonary/Chest: Effort normal and breath sounds normal. No respiratory distress. She has no wheezes. She has no rales. She exhibits no tenderness.  Abdominal: Soft. Bowel sounds are normal. She exhibits no distension and no mass. There is tenderness. There is no rebound and no guarding.  Moderate right upper quadrant tenderness, no other focal abdominal tenderness  Musculoskeletal:  Normal range of motion. She exhibits no edema or tenderness.  Neurological: She is alert and oriented to person, place, and time.  Skin: Skin is warm and dry.  Psychiatric: She has a normal mood and affect. Her behavior is normal. Judgment and thought content normal.  Nursing note and vitals reviewed.   ED Course  Procedures (including critical care time)  Results for orders placed or performed during the hospital encounter of 01/14/16  Lipase, blood  Result Value Ref Range    Lipase 37 11 - 51 U/L  Comprehensive metabolic panel  Result Value Ref Range   Sodium 142 135 - 145 mmol/L   Potassium 3.6 3.5 - 5.1 mmol/L   Chloride 107 101 - 111 mmol/L   CO2 27 22 - 32 mmol/L   Glucose, Bld 103 (H) 65 - 99 mg/dL   BUN 16 6 - 20 mg/dL   Creatinine, Ser 5.28 0.44 - 1.00 mg/dL   Calcium 9.5 8.9 - 41.3 mg/dL   Total Protein 8.1 6.5 - 8.1 g/dL   Albumin 4.7 3.5 - 5.0 g/dL   AST 15 15 - 41 U/L   ALT 10 (L) 14 - 54 U/L   Alkaline Phosphatase 54 38 - 126 U/L   Total Bilirubin 0.4 0.3 - 1.2 mg/dL   GFR calc non Af Amer >60 >60 mL/min   GFR calc Af Amer >60 >60 mL/min   Anion gap 8 5 - 15  CBC  Result Value Ref Range   WBC 6.8 4.0 - 10.5 K/uL   RBC 4.47 3.87 - 5.11 MIL/uL   Hemoglobin 12.6 12.0 - 15.0 g/dL   HCT 24.4 01.0 - 27.2 %   MCV 85.7 78.0 - 100.0 fL   MCH 28.2 26.0 - 34.0 pg   MCHC 32.9 30.0 - 36.0 g/dL   RDW 53.6 64.4 - 03.4 %   Platelets 243 150 - 400 K/uL  Urinalysis, Routine w reflex microscopic (not at [facility])  Result Value Ref Range   Color, Urine YELLOW YELLOW   APPearance CLEAR CLEAR   Specific Gravity, Urine 1.025 1.005 - 1.030   pH 5.5 5.0 - 8.0   Glucose, UA NEGATIVE NEGATIVE mg/dL   Hgb urine dipstick SMALL (A) NEGATIVE   Bilirubin Urine NEGATIVE NEGATIVE   Ketones, ur NEGATIVE NEGATIVE mg/dL   Protein, ur NEGATIVE NEGATIVE mg/dL   Nitrite NEGATIVE NEGATIVE   Leukocytes, UA SMALL (A) NEGATIVE  Urine microscopic-add on  Result Value Ref Range   Squamous Epithelial / LPF 0-5 (A) NONE SEEN   WBC, UA 0-5 0 - 5 WBC/hpf   RBC / HPF 6-30 0 - 5 RBC/hpf   Bacteria, UA RARE (A) NONE SEEN   US Abdomen Limited  01/15/2016  CLINICAL DATA:  Acute onset of right upper quadrant abdominal pain. Initial encounter. EXAM: US ABDOMEN LIMITED - RIGHT UPPER QUADRANT COMPARISON:  CT of the abdomen and pelvis performed 03/17/2013, and abdominal ultrasound performed 09/27/2014 FINDINGS: Gallbladder: No gallstones or wall thickening visualized. No sonographic  Murphy sign noted by sonographer. Common bile duct: Diameter: 0.2 cm, within normal limits in caliber. Liver: No focal lesion identified. Within normal limits in parenchymal echogenicity. IMPRESSION: Unremarkable ultrasound of the right upper quadrant. Electronically Signed   By: Roanna Raider M.D.   On: 01/15/2016 02:39     I have personally reviewed and evaluated these images and lab results as part of my medical decision-making.    MDM   Final diagnoses:  Right upper quadrant pain  Patient with intermittent right upper quadrant abdominal pain. She is quite tender to palpation. Will check right upper quadrant ultrasound. Vital signs are stable. Lipase normal. LFTs are normal.  Right upper quadrant ultrasound is negative. Patient is not vomiting. Will recommend primary care follow-up. Patient understands and agrees to plan. She is stable and ready for discharge.    Roxy Horseman, PA-C 01/15/16 0250  Raeford Razor, MD 01/15/16 [phone]

## 2016-01-15 NOTE — Discharge Instructions (Signed)
Abdominal Pain, Adult Many things can cause abdominal pain. Usually, abdominal pain is not caused by a disease and will improve without treatment. It can often be observed and treated at home. Your health care provider will do a physical exam and possibly order blood tests and X-rays to help determine the seriousness of your pain. However, in many cases, more time must pass before a clear cause of the pain can be found. Before that point, your health care provider may not know if you need more testing or further treatment. HOME CARE INSTRUCTIONS Monitor your abdominal pain for any changes. The following actions may help to alleviate any discomfort you are experiencing:  Only take over-the-counter or prescription medicines as directed by your health care provider.  Do not take laxatives unless directed to do so by your health care provider.  Try a clear liquid diet (broth, tea, or water) as directed by your health care provider. Slowly move to a bland diet as tolerated. SEEK MEDICAL CARE IF:  You have unexplained abdominal pain.  You have abdominal pain associated with nausea or diarrhea.  You have pain when you urinate or have a bowel movement.  You experience abdominal pain that wakes you in the night.  You have abdominal pain that is worsened or improved by eating food.  You have abdominal pain that is worsened with eating fatty foods.  You have a fever. SEEK IMMEDIATE MEDICAL CARE IF:  Your pain does not go away within 2 hours.  You keep throwing up (vomiting).  Your pain is felt only in portions of the abdomen, such as the right side or the left lower portion of the abdomen.  You pass bloody or black tarry stools. MAKE SURE YOU:  Understand these instructions.  Will watch your condition.  Will get help right away if you are not doing well or get worse.   This information is not intended to replace advice given to you by your health care provider. Make sure you discuss  any questions you have with your health care provider.   Document Released: 08/17/2005 Document Revised: 07/29/2015 Document Reviewed: 07/17/2013 Elsevier Interactive Patient Education 2016 [place].  Biliary Colic Biliary colic is a pain in the upper abdomen. The pain:  Is usually felt on the right side of the abdomen, but it may also be felt in the center of the abdomen, just below the breastbone (sternum).  May spread back toward the right shoulder blade.  May be steady or irregular.  May be accompanied by nausea and vomiting. Most of the time, the pain goes away in 1-5 hours. After the most intense pain passes, the abdomen may continue to ache mildly for about 24 hours. Biliary colic is caused by a blockage in the bile duct. The bile duct is a pathway that carries bile--a liquid that helps to digest fats--from the gallbladder to the small intestine. Biliary colic usually occurs after eating, when the digestive system demands bile. The pain develops when muscle cells contract forcefully to try to move the blockage so that bile can get by. HOME CARE INSTRUCTIONS  Take medicines only as directed by your health care provider.  Drink enough fluid to keep your urine clear or pale yellow.  Avoid fatty, greasy, and fried foods. These kinds of foods increase your body's demand for bile.  Avoid any foods that make your pain worse.  Avoid overeating.  Avoid having a large meal after fasting. SEEK MEDICAL CARE IF:  You develop a fever.  Your pain gets worse. °· You vomit. °· You develop nausea that prevents you from eating and drinking. °SEEK IMMEDIATE MEDICAL CARE IF: °· You suddenly develop a fever and shaking chills. °· You develop a yellowish discoloration (jaundice) of: °¨ Skin. °¨ Whites of the eyes. °¨ Mucous membranes. °· You have continuous or severe pain that is not relieved with medicines. °· You have nausea and vomiting that is not relieved with medicines. °· You develop  dizziness or you faint. °  °This information is not intended to replace advice given to you by your health care provider. Make sure you discuss any questions you have with your health care provider. °  °Document Released: 04/10/2006 Document Revised: 03/24/2015 Document Reviewed: 08/19/2014 °Elsevier Interactive Patient Education ©2016 Elsevier Inc. ° °

## 2016-01-20 ENCOUNTER — Encounter (HOSPITAL_COMMUNITY): Payer: Self-pay | Admitting: Emergency Medicine

## 2016-01-20 ENCOUNTER — Emergency Department (HOSPITAL_COMMUNITY)
Admission: EM | Admit: 2016-01-20 | Discharge: 2016-01-21 | Disposition: A | Discharge status: Home / Self Care | Payer: Self-pay | Attending: Emergency Medicine | Admitting: Emergency Medicine

## 2016-01-20 DIAGNOSIS — M79601 Pain in right arm: Secondary | ICD-10-CM | POA: Insufficient documentation

## 2016-01-20 DIAGNOSIS — R1011 Right upper quadrant pain: Secondary | ICD-10-CM | POA: Insufficient documentation

## 2016-01-20 DIAGNOSIS — R5383 Other fatigue: Secondary | ICD-10-CM | POA: Insufficient documentation

## 2016-01-20 DIAGNOSIS — R0789 Other chest pain: Secondary | ICD-10-CM | POA: Insufficient documentation

## 2016-01-20 DIAGNOSIS — Z87440 Personal history of urinary (tract) infections: Secondary | ICD-10-CM | POA: Insufficient documentation

## 2016-01-20 DIAGNOSIS — Z87448 Personal history of other diseases of urinary system: Secondary | ICD-10-CM | POA: Insufficient documentation

## 2016-01-20 DIAGNOSIS — R531 Weakness: Secondary | ICD-10-CM | POA: Insufficient documentation

## 2016-01-20 DIAGNOSIS — M549 Dorsalgia, unspecified: Secondary | ICD-10-CM | POA: Insufficient documentation

## 2016-01-20 LAB — CBC
HEMATOCRIT: 38.6 % (ref 36.0–46.0)
Hemoglobin: 12.8 g/dL (ref 12.0–15.0)
MCH: 28.4 pg (ref 26.0–34.0)
MCHC: 33.2 g/dL (ref 30.0–36.0)
MCV: 85.6 fL (ref 78.0–100.0)
PLATELETS: 212 10*3/uL (ref 150–400)
RBC: 4.51 MIL/uL (ref 3.87–5.11)
RDW: 13.7 % (ref 11.5–15.5)
WBC: 7.2 10*3/uL (ref 4.0–10.5)

## 2016-01-20 LAB — I-STAT BETA HCG BLOOD, ED (MC, WL, AP ONLY): I-stat hCG, quantitative: <5 m[IU]/mL (ref <5)

## 2016-01-20 NOTE — ED Notes (Signed)
Pt states that she was seen for RUQ pain but it has persisted. PCP can't get her in to be seen until 3/21. Doesn't want to take to take the pain meds because they make her sleepy. Alert and oriented.

## 2016-01-20 NOTE — ED Notes (Signed)
Provider in room  

## 2016-01-20 NOTE — ED Provider Notes (Signed)
CSN: [id number]     Arrival date & time 01/20/16  2233 History  By signing my name below, I, Budd Palmer, attest that this documentation has been prepared under the direction and in the presence of Eber Hong, MD. Electronically Signed: Budd Palmer, ED Scribe. 01/21/2016. 12:10 AM.     Chief Complaint  Patient presents with  . Abdominal Pain   The history is provided by the patient. No language interpreter was used.   HPI Comments: Brittany [name] is a 50 y.o. female with a PMHx of renal disorder and kidney stones who presents to the Emergency Department complaining of intermittent, worsening, RUQ pain onset 3 weeks ago. Pt states she was seen for the same 4 days ago, when she had normal lab workups and [place] results, and was advised to f/u with her PCP. She notes her PCP will not be able to see her until 3/21 and that she has come to the ED due to worsening symptoms and spreading pains. She reports associated right-sided breast, arm and back pain onset 4 days ago after leaving the hospital, as well as weakness, fatigue and loss of energy. She notes she is able to eat normally and drinks nothing but coffee and water. She states she did drink some Pepsi today. She denies exacerbation of the pain with deep breathing. She notes a PMHx of appendectomy for appendicitis at the age of 1. She denies any recent travel as well as smoking or drinking alcohol. Pt denies any pains on her left sided chest pains and pain in her BLE.  Past Medical History  Diagnosis Date  . Renal disorder     kidney stones  . Kidney stones   . Kidney stones    Past Surgical History  Procedure Laterality Date  . Appendectomy    . Mass excision N/A 02/17/2014    Procedure: EXCISION MASS OF SCALP WITH FLAP CLOSURE;  Surgeon: Louisa Second, MD;  Location: [facility];  Service: Plastics;  Laterality: N/A;   Family History  Problem Relation Age of Onset  . Diabetes Mother   . Diabetes Father    Social  History  Substance Use Topics  . Smoking status: Never Smoker   . Smokeless tobacco: None  . Alcohol Use: No   OB History    No data available     Review of Systems  Constitutional: Positive for activity change and fatigue.  Gastrointestinal: Positive for abdominal pain.  Musculoskeletal: Positive for myalgias and back pain.  Neurological: Positive for weakness.  All other systems reviewed and are negative.   Allergies  Review of patient's allergies indicates no known allergies.  Home Medications   Prior to Admission medications   Medication Sig Start Date End Date Taking? Authorizing Provider  ibuprofen (ADVIL,MOTRIN) 800 MG tablet Take 1 tablet (800 mg total) by mouth 3 (three) times daily. 01/21/16   Eber Hong, MD   BP 124/80 mmHg  Pulse 62  Temp(Src) 98.1 F (36.7 C) (Oral)  Resp 20  Ht 5\' 7"  (1.702 m)  Wt 133 lb (60.328 kg)  BMI 20.83 kg/m2  SpO2 99% Physical Exam  Constitutional: She is oriented to person, place, and time. She appears well-developed and well-nourished. No distress.  HENT:  Head: Normocephalic and atraumatic.  Mouth/Throat: Oropharynx is clear and moist. No oropharyngeal exudate.  Eyes: Conjunctivae and EOM are normal. Pupils are equal, round, and reactive to light. Right eye exhibits no discharge. Left eye exhibits no discharge. No scleral icterus.  Neck:  Normal range of motion. Neck supple. No JVD present. No thyromegaly present.  Cardiovascular: Normal rate, regular rhythm, normal heart sounds and intact distal pulses.  Exam reveals no gallop and no friction rub.   No murmur heard. Pulmonary/Chest: Effort normal and breath sounds normal. No respiratory distress. She has no wheezes. She has no rales. She exhibits tenderness.  No pain with deep breathing, exquisite TTP over R chest wall from the costal margin into the axilla, no pain with palpation of the arm, no TTP of the L chest wall  Abdominal: Soft. Bowel sounds are normal. She exhibits no  distension and no mass. There is no tenderness.  Abdomen entirely soft and non-tender  Musculoskeletal: Normal range of motion. She exhibits no edema or tenderness.  No edema or asymmetry  Lymphadenopathy:    She has no cervical adenopathy.  Neurological: She is alert and oriented to person, place, and time. Coordination normal.  Skin: Skin is warm and dry. No rash noted. She is not diaphoretic. No erythema.  Psychiatric: She has a normal mood and affect. Her behavior is normal.  Nursing note and vitals reviewed.   ED Course  Procedures  DIAGNOSTIC STUDIES: Oxygen Saturation is 99% on RA, normal by my interpretation.    COORDINATION OF CARE: 11:57 PM - Discussed plans to order further diagnostic studies and imaging. Pt advised of plan for treatment and pt agrees.  Labs Review Labs Reviewed  COMPREHENSIVE METABOLIC PANEL - Abnormal; Notable for the following:    Glucose, Bld 103 (*)    ALT 9 (*)    All other components within normal limits  LIPASE, BLOOD  CBC  URINALYSIS, ROUTINE W REFLEX MICROSCOPIC (NOT AT [facility])  I-STAT BETA HCG BLOOD, ED ([facility], [facility], [facility] ONLY)    Imaging Review Ct Angio Chest Pe W/cm &/or Wo Cm  01/21/2016  CLINICAL DATA:  50 year old female with right-sided breast, arm, and back pain. EXAM: CT ANGIOGRAPHY CHEST WITH CONTRAST TECHNIQUE: Multidetector CT imaging of the chest was performed using the standard protocol during bolus administration of intravenous contrast. Multiplanar CT image reconstructions and MIPs were obtained to evaluate the vascular anatomy. CONTRAST:  OMNIPAQUE IOHEXOL 350 MG/ML SOLN COMPARISON:  None. FINDINGS: The lungs are clear. Minimal bibasilar atelectatic changes noted. There is no pleural effusion or pneumothorax. The central airways are patent. The thoracic aorta appears unremarkable. No CT evidence of pulmonary embolism. There is no cardiomegaly or pericardial effusion. No hilar or mediastinal adenopathy. There is mild induration of  the anterior mediastinal fat. The esophagus is grossly unremarkable. No thyroid nodule identified. There is no axillary adenopathy. The chest wall soft tissues appear unremarkable. No acute osseous pathology identified. The visualized upper abdomen appears unremarkable. Review of the MIP images confirms the above findings. IMPRESSION: No CT evidence of pulmonary embolism. Electronically Signed   By: Elgie Collard M.D.   On: 01/21/2016 01:22   I have personally reviewed and evaluated these images and lab results as part of my medical decision-making.    MDM   Final diagnoses:  Chest wall pain    ED ECG REPORT  I personally interpreted this EKG   Date: 01/21/2016   Rate: 64  Rhythm: normal sinus rhythm  QRS Axis: normal  Intervals: normal  ST/T Wave abnormalities: normal  Conduction Disutrbances:none  Narrative Interpretation:   Old EKG Reviewed: none available   CT angio chest neg - no findings on the chest wall, lungs clear, labs normal and ECG unremarkable. Pt informed of results -  is well apeparing, expresses understanding to d/c instructions Stable for d/c. Family at bedside and agreeable to plan.  I personally performed the services described in this documentation, which was scribed in my presence. The recorded information has been reviewed and is accurate.     Eber Hong, MD 01/21/16 [id number]

## 2016-01-21 ENCOUNTER — Encounter (HOSPITAL_COMMUNITY): Payer: Self-pay | Admitting: Radiology

## 2016-01-21 ENCOUNTER — Emergency Department (HOSPITAL_COMMUNITY): Payer: Self-pay

## 2016-01-21 LAB — URINALYSIS, ROUTINE W REFLEX MICROSCOPIC
Bilirubin Urine: NEGATIVE
GLUCOSE, UA: NEGATIVE mg/dL
KETONES UR: NEGATIVE mg/dL
Nitrite: NEGATIVE
PROTEIN: NEGATIVE mg/dL
Specific Gravity, Urine: 1.016 (ref 1.005–1.030)
pH: 6.5 (ref 5.0–8.0)

## 2016-01-21 LAB — COMPREHENSIVE METABOLIC PANEL
ALBUMIN: 4.7 g/dL (ref 3.5–5.0)
ALK PHOS: 54 U/L (ref 38–126)
ALT: 9 U/L — ABNORMAL LOW (ref 14–54)
ANION GAP: 9 (ref 5–15)
AST: 15 U/L (ref 15–41)
BILIRUBIN TOTAL: 0.5 mg/dL (ref 0.3–1.2)
BUN: 14 mg/dL (ref 6–20)
CALCIUM: 9.8 mg/dL (ref 8.9–10.3)
CO2: 28 mmol/L (ref 22–32)
Chloride: 105 mmol/L (ref 101–111)
Creatinine, Ser: 0.44 mg/dL (ref 0.44–1.00)
GLUCOSE: 103 mg/dL — AB (ref 65–99)
Potassium: 3.6 mmol/L (ref 3.5–5.1)
SODIUM: 142 mmol/L (ref 135–145)
TOTAL PROTEIN: 8.1 g/dL (ref 6.5–8.1)

## 2016-01-21 LAB — URINE MICROSCOPIC-ADD ON

## 2016-01-21 LAB — LIPASE, BLOOD: Lipase: 36 U/L (ref 11–51)

## 2016-01-21 MED ORDER — IBUPROFEN 800 MG PO TABS: 800.0000 mg | ORAL_TABLET | Freq: Three times a day (TID) | ORAL | Status: DC

## 2016-01-21 MED ORDER — IOHEXOL 350 MG/ML SOLN
100.0000 mL | Freq: Once | INTRAVENOUS | Status: AC | PRN
  Administered 2016-01-21: 100 mL via INTRAVENOUS

## 2016-01-21 NOTE — ED Notes (Signed)
In CT

## 2016-01-21 NOTE — Discharge Instructions (Signed)

## 2016-04-14 ENCOUNTER — Encounter (HOSPITAL_COMMUNITY): Payer: Self-pay | Admitting: Emergency Medicine

## 2016-04-14 ENCOUNTER — Emergency Department (HOSPITAL_COMMUNITY)
Admission: EM | Admit: 2016-04-14 | Discharge: 2016-04-15 | Disposition: A | Discharge status: Home / Self Care | Payer: Medicaid Other | Attending: Emergency Medicine | Admitting: Emergency Medicine

## 2016-04-14 DIAGNOSIS — Y999 Unspecified external cause status: Secondary | ICD-10-CM | POA: Insufficient documentation

## 2016-04-14 DIAGNOSIS — X58XXXA Exposure to other specified factors, initial encounter: Secondary | ICD-10-CM | POA: Insufficient documentation

## 2016-04-14 DIAGNOSIS — R1310 Dysphagia, unspecified: Secondary | ICD-10-CM | POA: Insufficient documentation

## 2016-04-14 DIAGNOSIS — Y939 Activity, unspecified: Secondary | ICD-10-CM | POA: Insufficient documentation

## 2016-04-14 DIAGNOSIS — Z791 Long term (current) use of non-steroidal anti-inflammatories (NSAID): Secondary | ICD-10-CM | POA: Insufficient documentation

## 2016-04-14 DIAGNOSIS — Y929 Unspecified place or not applicable: Secondary | ICD-10-CM | POA: Insufficient documentation

## 2016-04-14 NOTE — ED Notes (Signed)
Pt c/o french fry stuck in throat since Sunday. Pt denies any breathing difficulties. Pt able to eat and drink but sts she is unable to get the fry unstuck. Pt A&Ox4 and ambulatory. Pt c/o some pain when swallowing.

## 2016-04-15 ENCOUNTER — Ambulatory Visit (INDEPENDENT_AMBULATORY_CARE_PROVIDER_SITE_OTHER): Payer: Self-pay | Admitting: Gastroenterology

## 2016-04-15 ENCOUNTER — Telehealth: Payer: Self-pay

## 2016-04-15 ENCOUNTER — Encounter: Payer: Self-pay | Admitting: Gastroenterology

## 2016-04-15 VITALS — BP 105/68 | HR 72 | Ht 66.0 in | Wt 134.8 lb

## 2016-04-15 DIAGNOSIS — R1310 Dysphagia, unspecified: Secondary | ICD-10-CM

## 2016-04-15 DIAGNOSIS — R070 Pain in throat: Secondary | ICD-10-CM | POA: Insufficient documentation

## 2016-04-15 MED ORDER — SUCRALFATE 1 GM/10ML PO SUSP: 1.0000 g | Freq: Three times a day (TID) | ORAL | Status: DC

## 2016-04-15 NOTE — ED Provider Notes (Signed)
CSN: [id number]     Arrival date & time 04/14/16  2113 History  By signing my name below, I, Brittany [name], attest that this documentation has been prepared under the direction and in the presence of Derwood Kaplan, MD.  Electronically Signed: Arlan [name], ED Scribe. 04/15/2016. 12:14 AM.  Chief Complaint  Patient presents with  . Swallowed Foreign Body    Clent Ridges stuck in throat   The history is provided by the patient and a relative. No language interpreter was used.   HPI Comments: Brittany [name] is a 50 y.o. female without any pertinent PMHx who presents to the Emergency Department complaining of gradually worsening, constant throat pain that started 3 days ago. She reports that she was eating french fries and she thinks one of them got stuck. Pt has eaten food since incident and reports pain when swallowing. Pt states that she tried to drink water and olive oil without any improvement. She denies any regurgitation of other food. No prior hx of similar symptoms. Denies PMHx of DM, HTN. Denies trouble breathing.  Past Medical History  Diagnosis Date  . Renal disorder     kidney stones  . Kidney stones   . Kidney stones    Past Surgical History  Procedure Laterality Date  . Appendectomy    . Mass excision N/A 02/17/2014    Procedure: EXCISION MASS OF SCALP WITH FLAP CLOSURE;  Surgeon: Louisa Second, MD;  Location: [facility];  Service: Plastics;  Laterality: N/A;   Family History  Problem Relation Age of Onset  . Diabetes Mother   . Diabetes Father    Social History  Substance Use Topics  . Smoking status: Never Smoker   . Smokeless tobacco: None  . Alcohol Use: No   OB History    No data available     Review of Systems A complete 10 system review of systems was obtained and all systems are negative except as noted in the HPI and PMH.   Allergies  Review of patient's allergies indicates no known allergies.  Home Medications   Prior to Admission  medications   Medication Sig Start Date End Date Taking? Authorizing Provider  ibuprofen (ADVIL,MOTRIN) 800 MG tablet Take 1 tablet (800 mg total) by mouth 3 (three) times daily. Patient not taking: Reported on 04/14/2016 01/21/16   Eber Hong, MD   BP 134/80 mmHg  Pulse 65  Temp(Src) 98.2 F (36.8 C) (Oral)  Resp 14  SpO2 97%   Physical Exam  Constitutional: She is oriented to person, place, and time. She appears well-developed and well-nourished.  HENT:  Head: Normocephalic.  Posterior pharynx is erythemas, no tonsillar exudates or swelling.   Eyes: EOM are normal.  Neck: Normal range of motion.  Cardiovascular: Normal rate, regular rhythm and normal heart sounds.   Pulmonary/Chest: Effort normal and breath sounds normal. No respiratory distress. She has no wheezes. She has no rales.  No strenuous breath sounds.   Abdominal: She exhibits no distension.  Musculoskeletal: Normal range of motion.  Neurological: She is alert and oriented to person, place, and time.  Psychiatric: She has a normal mood and affect.  Nursing note and vitals reviewed.   ED Course  Procedures (including critical care time) DIAGNOSTIC STUDIES: Oxygen Saturation is 97% on RA, normal by my interpretation.    COORDINATION OF CARE: 12:12 AM-Discussed treatment plan which includes DG throat with pt at bedside and pt agreed to plan.   Labs Review Labs Reviewed - No data  to display  Imaging Review No results found. I have personally reviewed and evaluated these images and lab results as part of my medical decision-making.   EKG Interpretation None      MDM   Final diagnoses:  Dysphagia    I personally performed the services described in this documentation, which was scribed in my presence. The recorded information has been reviewed and is accurate.   Pt comes in with cc of dysphagia. Alleges that the symptoms started after she ate french fries.  It's possible that she is having pill  esophagitis like symptoms. I doubt food impaction at this point - she is able to tolerate po. We have called GI for an expedited outpatient f/u - since we dont have a clear diagnosis. Pt doesn't want to stay for an Xrays - which i think should be ok.     Derwood KaplanAnkit Pheobe Sandiford, MD 04/15/16 [phone]

## 2016-04-15 NOTE — ED Provider Notes (Signed)
Patient called, - [phone]. Informed her daughter to inform her mother to stay NPO and not fill the rx - all per GI recommendations.    Brittany KaplanAnkit Korianna Washer, MD 04/15/16 0200

## 2016-04-15 NOTE — Progress Notes (Addendum)
04/15/2016 Brittany [name] [id number] Sep 07, 1966   HISTORY OF PRESENT ILLNESS:  This is a pleasant 50 year old female who is new to our office and was referred here by the emergency department, Dr. Rhunette [name], for evaluation regarding seems to be more like throat pain. She says that on Monday, 4 days ago, she ate some french fries and felt like one of them got stuck. Since that time she's had sensation that there is some still something there and some discomfort when eating, but has been able to eat and drink successfully. She denies any regurgitation, nausea, vomiting. She denies any similar episodes in the past. She was given a prescription for Carafate when she was in the emergency department yesterday, which she has not yet started They actually really didn't do much evaluation while she was there. She does not currently have insurance andhas never undergone colonoscopy previously. We did discuss this and she is agreeable but would like to wait until she is able to work something out with insurance.     Past Medical History  Diagnosis Date  . Renal disorder     kidney stones  . Kidney stones   . Kidney stones    Past Surgical History  Procedure Laterality Date  . Appendectomy    . Mass excision N/A 02/17/2014    Procedure: EXCISION MASS OF SCALP WITH FLAP CLOSURE;  Surgeon: Brittany Second, MD;  Location: [facility];  Service: Plastics;  Laterality: N/A;    reports that she has never smoked. She does not have any smokeless tobacco history on file. She reports that she does not drink alcohol or use illicit drugs. family history includes Diabetes in her father and mother. No Known Allergies    Outpatient Encounter Prescriptions as of 04/15/2016  Medication Sig  . ibuprofen (ADVIL,MOTRIN) 800 MG tablet Take 1 tablet (800 mg total) by mouth 3 (three) times daily. (Patient not taking: Reported on 04/14/2016)  . sucralfate (CARAFATE) 1 GM/10ML suspension Take 10 mLs (1 g  total) by mouth 4 (four) times daily -  with meals and at bedtime. (Patient not taking: Reported on 04/15/2016)   No facility-administered encounter medications on file as of 04/15/2016.     REVIEW OF SYSTEMS  : All other systems reviewed and negative except where noted in the History of Present Illness.   PHYSICAL EXAM: BP 105/68 mmHg  Pulse 72  Ht  (1.676 m)  Wt 134 lb 12.8 oz (61.145 kg)  BMI 21.77 kg/m2 General: Well developed female in no acute distress Head: Normocephalic and atraumatic Eyes:  Sclerae anicteric, conjunctiva pink. Ears: Normal auditory acuity Lungs: Clear throughout to auscultation Heart: Regular rate and rhythm Abdomen: Soft, non-distended.  Normal bowel sounds.  Non-tender. Musculoskeletal: Symmetrical with no gross deformities  Skin: No lesions on visible extremities Extremities: No edema  Neurological: Alert oriented x 4, grossly non-focal Psychological:  Alert and cooperative. Normal mood and affect  ASSESSMENT AND PLAN: -50 year old female with globus sensation/throat pain that has been present for 4 days after eating some french fries. She does not have any obstructive symptoms as she has continues to eat and drink successfully with just some minimal discomfort. I am unsure what is causing her symptoms. Possibly the food could have been stuck transiently causing some inflammation and edema, ultimately causing her ongoing symptoms. She may have a Zenker's diverticulum although she's never had a history of similar episode in the past. We'll start by ordering an esophagram to see if this  is anything that needs to be addressed endoscopically. She may actually need referral to ENT pending the results of the esophagram and if she has ongoing symptoms.   -Screening for colon cancer:  I did discuss this with her and she is willing to proceed, but would like to hold off for now until she to get something worked out for [name].  She will call back at a later  date.   CC:  Brittany [name], Edwin, MD  Addendum: Reviewed and agree with initial management. Beverley FiedlerJay M Pyrtle, MD

## 2016-04-15 NOTE — Discharge Instructions (Signed)
We have paged out the GI doctor - and will inform them about need for an expedited follow up. Please call the number provided adn set up an appointment.  Return to the ER if you cannot keep any food down.   Dysphagia Swallowing problems (dysphagia) occur when solids and liquids seem to stick in your throat on the way down to your stomach, or the food takes longer to get to the stomach. Other symptoms include regurgitating food, noises coming from the throat, chest discomfort with swallowing, and a feeling of fullness or the feeling of something being stuck in your throat when swallowing. When blockage in your throat is complete, it may be associated with drooling. CAUSES  Problems with swallowing may occur because of problems with the muscles. The food cannot be propelled in the usual manner into your stomach. You may have ulcers, scar tissue, or inflammation in the tube down which food travels from your mouth to your stomach (esophagus), which blocks food from passing normally into the stomach. Causes of inflammation include:  Acid reflux from your stomach into your esophagus.  Infection.  Radiation treatment for cancer.  Medicines taken without enough fluids to wash them down into your stomach. You may have nerve problems that prevent signals from being sent to the muscles of your esophagus to contract and move your food down to your stomach. Globus pharyngeus is a relatively common problem in which there is a sense of an obstruction or difficulty in swallowing, without any physical abnormalities of the swallowing passages being found. This problem usually improves over time with reassurance and testing to rule out other causes. DIAGNOSIS Dysphagia can be diagnosed and its cause can be determined by tests in which you swallow a white substance that helps illuminate the inside of your throat (contrast medium) while X-rays are taken. Sometimes a flexible telescope that is inserted down your  throat (endoscopy) to look at your esophagus and stomach is used. TREATMENT   If the dysphagia is caused by acid reflux or infection, medicines may be used.  If the dysphagia is caused by problems with your swallowing muscles, swallowing therapy may be used to help you strengthen your swallowing muscles.  If the dysphagia is caused by a blockage or mass, procedures to remove the blockage may be done. HOME CARE INSTRUCTIONS  Try to eat soft food that is easier to swallow and check your weight on a daily basis to be sure that it is not decreasing.  Be sure to drink liquids when sitting upright (not lying down). SEEK MEDICAL CARE IF:  You are losing weight because you are unable to swallow.  You are coughing when you drink liquids (aspiration).  You are coughing up partially digested food. SEEK IMMEDIATE MEDICAL CARE IF:  You are unable to swallow your own saliva .  You are having shortness of breath or a fever, or both.  You have a hoarse voice along with difficulty swallowing. MAKE SURE YOU:  Understand these instructions.  Will watch your condition.  Will get help right away if you are not doing well or get worse.   This information is not intended to replace advice given to you by your health care provider. Make sure you discuss any questions you have with your health care provider.   Document Released: 11/04/2000 Document Revised: 11/28/2014 Document Reviewed: 04/26/2013 Elsevier Interactive Patient Education [employer]2016 [employer]

## 2016-04-15 NOTE — Patient Instructions (Signed)
You have been scheduled for a Barium Esophogram at [facility] (1st floor of the hospital) on Thusday 04-21-2016  at 11:00 am Please arrive at 10:15 minutes  to your appointment for registration. Make certain not to have anything to eat or drink 3 hours  hours prior to your test. If you need to reschedule for any reason, please contact radiology at [phone] to do so.  The approximate cost of this test is $ 451.00 plus the cost of the barium which is about $20.00.  __________________________________________________________________ A barium swallow is an examination that concentrates on views of the esophagus. This tends to be a double contrast exam (barium and two liquids which, when combined, create a gas to distend the wall of the oesophagus) or single contrast (non-ionic iodine based). The study is usually tailored to your symptoms so a good history is essential. Attention is paid during the study to the form, structure and configuration of the esophagus, looking for functional disorders (such as aspiration, dysphagia, achalasia, motility and reflux) EXAMINATION You may be asked to change into a gown, depending on the type of swallow being performed. A radiologist and radiographer will perform the procedure. The radiologist will advise you of the type of contrast selected for your procedure and direct you during the exam. You will be asked to stand, sit or lie in several different positions and to hold a small amount of fluid in your mouth before being asked to swallow while the imaging is performed .In some instances you may be asked to swallow barium coated marshmallows to assess the motility of a solid food bolus. The exam can be recorded as a digital or video fluoroscopy procedure. POST PROCEDURE It will take 1-2 days for the barium to pass through your system. To facilitate this, it is important, unless otherwise directed, to increase your fluids for the next 24-48hrs and to resume your  normal diet.  This test typically takes about 30 minutes to perform. __________________________________________________________________________________

## 2016-04-15 NOTE — Telephone Encounter (Signed)
Pt was reached on home phone number. She will come for appt today.

## 2016-04-15 NOTE — Telephone Encounter (Signed)
Per Dr Myrtie Neitheranis, Please have pt come in for a follow up ER visit. Doug SouJessica Zehr has opening at 2pm today . Pt has been booked for follow up. Left message to return call.

## 2016-04-19 ENCOUNTER — Other Ambulatory Visit: Payer: Self-pay

## 2016-04-19 DIAGNOSIS — R1310 Dysphagia, unspecified: Secondary | ICD-10-CM

## 2016-04-21 ENCOUNTER — Ambulatory Visit (HOSPITAL_COMMUNITY)
Admission: RE | Admit: 2016-04-21 | Discharge: 2016-04-21 | Disposition: A | Discharge status: Home / Self Care | Payer: Self-pay | Source: Ambulatory Visit | Attending: Gastroenterology | Admitting: Gastroenterology

## 2016-04-21 DIAGNOSIS — R1310 Dysphagia, unspecified: Secondary | ICD-10-CM | POA: Insufficient documentation

## 2016-04-25 ENCOUNTER — Encounter: Payer: Self-pay | Admitting: *Deleted

## 2016-04-26 ENCOUNTER — Telehealth: Payer: Self-pay | Admitting: Gastroenterology

## 2016-04-26 NOTE — Telephone Encounter (Signed)
Ok to refer to ENT. I do not have a preference since I'm not too familiar with them.

## 2016-04-26 NOTE — Telephone Encounter (Signed)
Spoke with patient's daughter and she still has the feeling that something is in her throat. She is asking for a ENT referral. She does not have one in mind. She also states her mother is constipated after drinking the Barium. She will try Miralax up to 3 doses for this. Ok to refer her? Any preference for ENT?

## 2016-04-27 NOTE — Telephone Encounter (Signed)
Scheduled [facility] at [facility] with Dr. Pollyann Kennedyosen on 05/20/16 at 2:40 PM. She will need to bring $200 for visit since Medicaid is not current. ([address]) [id number]. Left a message for patient's daughter to call back.

## 2016-04-28 NOTE — Telephone Encounter (Signed)
Left a message for patient's daughter to call back.

## 2016-04-29 NOTE — Telephone Encounter (Signed)
Left a message for patient's daughter to call back.

## 2016-05-02 ENCOUNTER — Encounter: Payer: Self-pay | Admitting: *Deleted

## 2016-05-02 NOTE — Telephone Encounter (Signed)
Letter mailed to patient will appointment date, time and instructions.

## 2016-05-20 DIAGNOSIS — K219 Gastro-esophageal reflux disease without esophagitis: Secondary | ICD-10-CM | POA: Insufficient documentation

## 2016-06-14 ENCOUNTER — Emergency Department (HOSPITAL_COMMUNITY): Payer: Self-pay

## 2016-06-14 ENCOUNTER — Emergency Department (HOSPITAL_COMMUNITY)
Admission: EM | Admit: 2016-06-14 | Discharge: 2016-06-14 | Disposition: A | Discharge status: Home / Self Care | Payer: Self-pay | Attending: Emergency Medicine | Admitting: Emergency Medicine

## 2016-06-14 ENCOUNTER — Encounter (HOSPITAL_COMMUNITY): Payer: Self-pay

## 2016-06-14 DIAGNOSIS — Z791 Long term (current) use of non-steroidal anti-inflammatories (NSAID): Secondary | ICD-10-CM | POA: Insufficient documentation

## 2016-06-14 DIAGNOSIS — G8929 Other chronic pain: Secondary | ICD-10-CM | POA: Insufficient documentation

## 2016-06-14 DIAGNOSIS — R1011 Right upper quadrant pain: Secondary | ICD-10-CM | POA: Insufficient documentation

## 2016-06-14 DIAGNOSIS — K5900 Constipation, unspecified: Secondary | ICD-10-CM | POA: Insufficient documentation

## 2016-06-14 DIAGNOSIS — R109 Unspecified abdominal pain: Secondary | ICD-10-CM

## 2016-06-14 LAB — COMPREHENSIVE METABOLIC PANEL WITH GFR
ALT: 11 U/L — ABNORMAL LOW (ref 14–54)
AST: 17 U/L (ref 15–41)
Albumin: 4.5 g/dL (ref 3.5–5.0)
Alkaline Phosphatase: 50 U/L (ref 38–126)
Anion gap: 6 (ref 5–15)
BUN: 13 mg/dL (ref 6–20)
CO2: 27 mmol/L (ref 22–32)
Calcium: 9.1 mg/dL (ref 8.9–10.3)
Chloride: 107 mmol/L (ref 101–111)
Creatinine, Ser: 0.56 mg/dL (ref 0.44–1.00)
GFR calc Af Amer: >60 mL/min
GFR calc non Af Amer: >60 mL/min
Glucose, Bld: 96 mg/dL (ref 65–99)
Potassium: 3.5 mmol/L (ref 3.5–5.1)
Sodium: 140 mmol/L (ref 135–145)
Total Bilirubin: 0.8 mg/dL (ref 0.3–1.2)
Total Protein: 8.1 g/dL (ref 6.5–8.1)

## 2016-06-14 LAB — CBC
HCT: 37.5 % (ref 36.0–46.0)
Hemoglobin: 12.7 g/dL (ref 12.0–15.0)
MCH: 28.7 pg (ref 26.0–34.0)
MCHC: 33.9 g/dL (ref 30.0–36.0)
MCV: 84.7 fL (ref 78.0–100.0)
Platelets: 204 K/uL (ref 150–400)
RBC: 4.43 MIL/uL (ref 3.87–5.11)
RDW: 14 % (ref 11.5–15.5)
WBC: 5.6 K/uL (ref 4.0–10.5)

## 2016-06-14 LAB — URINALYSIS, ROUTINE W REFLEX MICROSCOPIC
Bilirubin Urine: NEGATIVE
GLUCOSE, UA: NEGATIVE mg/dL
Ketones, ur: NEGATIVE mg/dL
LEUKOCYTES UA: NEGATIVE
Nitrite: NEGATIVE
PH: 7 (ref 5.0–8.0)
Protein, ur: NEGATIVE mg/dL
SPECIFIC GRAVITY, URINE: 1.021 (ref 1.005–1.030)

## 2016-06-14 LAB — LIPASE, BLOOD: Lipase: 21 U/L (ref 11–51)

## 2016-06-14 LAB — URINE MICROSCOPIC-ADD ON: WBC, UA: NONE SEEN WBC/hpf (ref 0–5)

## 2016-06-14 MED ORDER — NAPROXEN 500 MG PO TABS: 500.0000 mg | ORAL_TABLET | Freq: Two times a day (BID) | ORAL | 0 refills | Status: DC

## 2016-06-14 MED ORDER — IBUPROFEN 200 MG PO TABS
600.0000 mg | ORAL_TABLET | Freq: Once | ORAL | Status: AC
  Administered 2016-06-14: 600 mg via ORAL
  Filled 2016-06-14: qty 3

## 2016-06-14 MED ORDER — POLYETHYLENE GLYCOL 3350 17 G PO PACK: 17.0000 g | PACK | Freq: Every day | ORAL | 0 refills | Status: DC

## 2016-06-14 NOTE — ED Notes (Signed)
PA at bedside.

## 2016-06-14 NOTE — Progress Notes (Signed)
[facility] consulted for insurance needs.  Brittany [name] spoke to Brittany [name] at bedside.  Brittany [name] reports her Medicaid was cancelled because her daughter is of age.  Brittany [name] reports her pcp is Dr. Ginette Otto and plans on keeping him as her pcp.  [facility] provided Brittany [name] with contact information for the Affordable Care Act and contact information for the orange card.  She reports she has applied for the orange card but she never heard back from them.  Brittany [name] also provided Brittany [name] with web site Goodrx.com and needymeds.org for medication assistance.  Brittany [name] thankful for assistance.  No further Brittany [name] needs at this time.

## 2016-06-14 NOTE — ED Triage Notes (Signed)
Pt with left abdominal pain x 2 months.  Evaluated and no findings.  However, pain has worsened.  No n/v/d or fever.

## 2016-06-14 NOTE — ED Provider Notes (Signed)
[facility]-EMERGENCY DEPT Provider Note   CSN: [id number] Arrival date & time: 06/14/16  1142  First Provider Contact:  First MD Initiated Contact with Patient 06/14/16 1404      History   Chief Complaint Chief Complaint  Patient presents with  . Abdominal Pain   HPI  Brittany [name] is an 50 y.o. female with history of kidney stones who presents to the ED for evaluation of RUQ abdominal pain. She states this has been an issue for "months." she has been seen here and as an outpatient multiple times for this complaint with negative workup. This includes x-rays, CT angio, and labs. She states the pain is in her RUQ and radiates up to her right breast and right ribs. She states it is intermittent. Denies nausea, vomiting, diarrhea. Denies fever or chills. Denies urinary symptoms. Denies other chest pain or SOB. She states she is here because it is hard to sleep due to the pain. She has not taken anything because she is not sure what she can take.  Past Medical History:  Diagnosis Date  . Kidney stones   . Kidney stones   . Renal disorder    kidney stones    Patient Active Problem List   Diagnosis Date Noted  . Dysphagia 04/15/2016  . Throat pain 04/15/2016    Past Surgical History:  Procedure Laterality Date  . APPENDECTOMY    . MASS EXCISION N/A 02/17/2014   Procedure: EXCISION MASS OF SCALP WITH FLAP CLOSURE;  Surgeon: Louisa Second, MD;  Location: [facility];  Service: Plastics;  Laterality: N/A;    OB History    No data available       Home Medications    Prior to Admission medications   Medication Sig Start Date End Date Taking? Authorizing Provider  ibuprofen (ADVIL,MOTRIN) 800 MG tablet Take 1 tablet (800 mg total) by mouth 3 (three) times daily. Patient not taking: Reported on 04/14/2016 01/21/16   Eber Hong, MD  sucralfate (CARAFATE) 1 GM/10ML suspension Take 10 mLs (1 g total) by mouth 4 (four) times daily -  with meals and at bedtime. Patient not  taking: Reported on 04/15/2016 04/15/16   Derwood Kaplan, MD    Family History Family History  Problem Relation Age of Onset  . Diabetes Mother   . Diabetes Father     Social History Social History  Substance Use Topics  . Smoking status: Never Smoker  . Smokeless tobacco: Not on file  . Alcohol use No     Allergies   Review of patient's allergies indicates no known allergies.   Review of Systems Review of Systems 10 Systems reviewed and are negative for acute change except as noted in the HPI.   Physical Exam Updated Vital Signs BP 128/90 (BP Location: Left Arm)   Pulse 68   Temp 98.5 F (36.9 C) (Oral)   Resp 16   Ht  (1.702 m)   Wt 59.9 kg   SpO2 100%   BMI 20.67 kg/m   Physical Exam  Constitutional: She is oriented to person, place, and time.  HENT:  Right Ear: External ear normal.  Left Ear: External ear normal.  Nose: Nose normal.  Mouth/Throat: Oropharynx is clear and moist. No oropharyngeal exudate.  Eyes: Conjunctivae and EOM are normal. Pupils are equal, round, and reactive to light.  Neck: Normal range of motion. Neck supple.  Cardiovascular: Normal rate, regular rhythm, normal heart sounds and intact distal pulses.   Pulmonary/Chest: Effort normal  and breath sounds normal. No respiratory distress. She has no wheezes. She exhibits no tenderness.  Abdominal: Soft. Bowel sounds are normal. She exhibits no distension. There is no rebound and no guarding.    Musculoskeletal: She exhibits no edema.  Neurological: She is alert and oriented to person, place, and time. No cranial nerve deficit.  Skin: Skin is warm and dry.  Psychiatric: She has a normal mood and affect.  Nursing note and vitals reviewed.    ED Treatments / Results  Labs (all labs ordered are listed, but only abnormal results are displayed) Labs Reviewed  COMPREHENSIVE METABOLIC PANEL - Abnormal; Notable for the following:       Result Value   ALT 11 (*)    All other  components within normal limits  URINALYSIS, ROUTINE W REFLEX MICROSCOPIC (NOT AT [facility]) - Abnormal; Notable for the following:    APPearance CLOUDY (*)    Hgb urine dipstick TRACE (*)    All other components within normal limits  URINE MICROSCOPIC-ADD ON - Abnormal; Notable for the following:    Squamous Epithelial / LPF 0-5 (*)    Bacteria, UA RARE (*)    All other components within normal limits  LIPASE, BLOOD  CBC    EKG  EKG Interpretation None       Radiology Dg Abd Acute W/chest  Result Date: 06/14/2016 CLINICAL DATA:  Pt having right side upper abd pain and right lower chest/breast pains x 3 days. SOB. Not taking HTN meds. Not diabetic. Nonsmoker. EXAM: DG ABDOMEN ACUTE W/ 1V CHEST COMPARISON:  CT 01/21/2016 FINDINGS: Frontal view of the chest demonstrates midline trachea. Normal heart size and mediastinal contours. No pleural effusion or pneumothorax. Clear lungs. Abdominal films demonstrate no free intraperitoneal air or significant air-fluid levels on upright positioning. No bowel distension on supine imaging. No abnormal abdominal calcifications. No appendicolith. Large colonic stool burden. IMPRESSION: No acute findings. Possible constipation. Electronically Signed   By: Jeronimo Greaves M.D.   On: 06/14/2016 14:52   Procedures Procedures (including critical care time)  Medications Ordered in ED Medications  ibuprofen (ADVIL,MOTRIN) tablet 600 mg (600 mg Oral Given 06/14/16 1432)     Initial Impression / Assessment and Plan / ED Course  I have reviewed the triage vital signs and the nursing notes.  Pertinent labs & imaging results that were available during my care of the patient were reviewed by me and considered in my medical decision making (see chart for details).  Pain improved with ibuprofen. Labs unrevealing. Acute abdominal x-r negative for acute findings though there is suggestion of constipation. Pt endorses constipation. Will give rx for miralax and naproxen.  Consulted CM to call pt as pt has many questions about insurance, re-establishing medicaid, and getting PCP f/u. ER return precautions given.   Final Clinical Impressions(s) / ED Diagnoses   Final diagnoses:  Chronic abdominal pain  Constipation, unspecified constipation type    New Prescriptions Discharge Medication List as of 06/14/2016  3:43 PM    START taking these medications   Details  naproxen (NAPROSYN) 500 MG tablet Take 1 tablet (500 mg total) by mouth 2 (two) times daily., Starting Tue 06/14/2016, Print    polyethylene glycol (MIRALAX) packet Take 17 g by mouth daily., Starting Tue 06/14/2016, Print         Carlene Coria, PA-C 06/14/16 2042    Doug Sou, MD 06/17/16 [phone]

## 2016-06-14 NOTE — Discharge Instructions (Signed)
Case management will contact you to help with primary care. Or, call the [facility] to establish primary care. Take medications as prescribed as needed.

## 2016-06-14 NOTE — ED Notes (Signed)
Patient transported to X-ray 

## 2016-08-16 ENCOUNTER — Encounter (HOSPITAL_COMMUNITY): Payer: Self-pay | Admitting: Emergency Medicine

## 2016-08-16 ENCOUNTER — Emergency Department (HOSPITAL_COMMUNITY)
Admission: EM | Admit: 2016-08-16 | Discharge: 2016-08-16 | Disposition: A | Discharge status: Home / Self Care | Payer: Self-pay | Attending: Emergency Medicine | Admitting: Emergency Medicine

## 2016-08-16 DIAGNOSIS — Z79899 Other long term (current) drug therapy: Secondary | ICD-10-CM | POA: Insufficient documentation

## 2016-08-16 DIAGNOSIS — R198 Other specified symptoms and signs involving the digestive system and abdomen: Secondary | ICD-10-CM | POA: Insufficient documentation

## 2016-08-16 DIAGNOSIS — K228 Other specified diseases of esophagus: Secondary | ICD-10-CM

## 2016-08-16 MED ORDER — OMEPRAZOLE 20 MG PO CPDR: 20.0000 mg | DELAYED_RELEASE_CAPSULE | Freq: Every day | ORAL | 1 refills | Status: DC

## 2016-08-16 NOTE — ED Provider Notes (Signed)
[facility]-EMERGENCY DEPT Provider Note   CSN: [id number] Arrival date & time: 08/16/16  1152     History   Chief Complaint Chief Complaint  Patient presents with  . Choking    Food Stuck In Throat    HPI Brittany [name] is a 50 y.o. female.  HPI Patient presents to the emergency room for evaluation of persistent dysphagia. Initially her symptoms started back in May. At that time she felt like a french fry got stuck in her throat after eating. She tried drinking water and olive oil without any improvement. She went to the emergency room for evaluation. Patient's symptoms were suggestive of esophagitis. She was discharged home with Carafate. Patient ended up following with gastroenterology in June because of persistent symptoms. She had a barium swallow that was normal. She then was referred to ENT. The patient was seen by Dr. Pollyann Kennedyosen. He suggested follow-up for possible direct laryngoscopy symptoms persisted. Patient states she continues to feel this way. She feels like something is moving in her throat at times. She is able to eat and drink without difficulty. She is not having any difficulty swallowing.  She has not seen the GI or ENT doctor since June Past Medical History:  Diagnosis Date  . Kidney stones   . Kidney stones   . Renal disorder    kidney stones    Patient Active Problem List   Diagnosis Date Noted  . Dysphagia 04/15/2016  . Throat pain 04/15/2016    Past Surgical History:  Procedure Laterality Date  . APPENDECTOMY    . MASS EXCISION N/A 02/17/2014   Procedure: EXCISION MASS OF SCALP WITH FLAP CLOSURE;  Surgeon: Louisa SecondGerald Truesdale, MD;  Location: [facility];  Service: Plastics;  Laterality: N/A;    OB History    No data available       Home Medications    Prior to Admission medications   Medication Sig Start Date End Date Taking? Authorizing Provider  naproxen (NAPROSYN) 500 MG tablet Take 1 tablet (500 mg total) by mouth 2 (two) times daily.  06/14/16  Yes Ace GinsSerena Y Sam, PA-C  polyethylene glycol [facility](MIRALAX) packet Take 17 g by mouth daily. 06/14/16  Yes Ace GinsSerena Y Sam, PA-C  ibuprofen (ADVIL,MOTRIN) 800 MG tablet Take 1 tablet (800 mg total) by mouth 3 (three) times daily. Patient not taking: Reported on 04/14/2016 01/21/16   Eber HongBrian Miller, MD  omeprazole (PRILOSEC) 20 MG capsule Take 1 capsule (20 mg total) by mouth daily. 08/16/16   Linwood DibblesJon Kenidi Elenbaas, MD  sucralfate (CARAFATE) 1 GM/10ML suspension Take 10 mLs (1 g total) by mouth 4 (four) times daily -  with meals and at bedtime. Patient not taking: Reported on 04/15/2016 04/15/16   Derwood KaplanAnkit Nanavati, MD    Family History Family History  Problem Relation Age of Onset  . Diabetes Mother   . Diabetes Father     Social History Social History  Substance Use Topics  . Smoking status: Never Smoker  . Smokeless tobacco: Never Used  . Alcohol use No     Allergies   Review of patient's allergies indicates no known allergies.   Review of Systems Review of Systems  All other systems reviewed and are negative.    Physical Exam Updated Vital Signs BP 134/83   Pulse 72   Temp 98.2 F (36.8 C)   Resp 16   SpO2 99%   Physical Exam  Constitutional: She appears well-developed and well-nourished. No distress.  HENT:  Head: Normocephalic and atraumatic.  Right Ear: External ear normal.  Left Ear: External ear normal.  Mouth/Throat: Oropharynx is clear and moist and mucous membranes are normal. No oropharyngeal exudate, posterior oropharyngeal edema, posterior oropharyngeal erythema or tonsillar abscesses.  Eyes: Conjunctivae are normal. Right eye exhibits no discharge. Left eye exhibits no discharge. No scleral icterus.  Neck: Neck supple. No tracheal deviation present.  Cardiovascular: Normal rate.   Pulmonary/Chest: Effort normal. No stridor. No respiratory distress.  Abdominal: She exhibits no distension.  Musculoskeletal: She exhibits no edema.  Neurological: She is alert. Cranial  nerve deficit: no gross deficits.  Skin: Skin is warm and dry. No rash noted.  Psychiatric: She has a normal mood and affect.  Nursing note and vitals reviewed.    ED Treatments / Results    Procedures Procedures (including critical care time)    Initial Impression / Assessment and Plan / ED Course  I have reviewed the triage vital signs and the nursing notes.  Pertinent labs & imaging results that were available during my care of the patient were reviewed by me and considered in my medical decision making (see chart for details).  Clinical Course    Patient is having a persistent sensation of something stuck in her throat.  She is not having any difficulty handling her saliva. She is not having any difficulty swallowing liquids or solids.  There is no emergency medical condition. The patient was previously instructed by Dr. Pollyann Kennedy that he may need to do direct imaging if her symptoms persist. I recommend that she contact him to make follow-up arrangements.  Final Clinical Impressions(s) / ED Diagnoses   Final diagnoses:  Sensation of foreign body in esophagus    New Prescriptions New Prescriptions   OMEPRAZOLE (PRILOSEC) 20 MG CAPSULE    Take 1 capsule (20 mg total) by mouth daily.     Linwood Dibbles, MD 08/16/16 [phone]

## 2016-08-16 NOTE — ED Notes (Signed)
Patient is A & O x4.  She understood discharge instructions. 

## 2016-08-16 NOTE — ED Triage Notes (Signed)
Patient in with complaints of food stuck in throat for 3 months. Report being seen here on 5/26 for same. Referred to ENT, seen on 6/30. Patient states that the food is still stuck. She has not been eating just drinking, unable to get it down.

## 2017-09-09 ENCOUNTER — Emergency Department (HOSPITAL_COMMUNITY)
Admission: EM | Admit: 2017-09-09 | Discharge: 2017-09-10 | Disposition: A | Discharge status: Home / Self Care | Payer: Self-pay | Attending: Emergency Medicine | Admitting: Emergency Medicine

## 2017-09-09 ENCOUNTER — Encounter (HOSPITAL_COMMUNITY): Payer: Self-pay | Admitting: Emergency Medicine

## 2017-09-09 DIAGNOSIS — Z79899 Other long term (current) drug therapy: Secondary | ICD-10-CM | POA: Insufficient documentation

## 2017-09-09 DIAGNOSIS — K115 Sialolithiasis: Secondary | ICD-10-CM | POA: Insufficient documentation

## 2017-09-09 DIAGNOSIS — R609 Edema, unspecified: Secondary | ICD-10-CM | POA: Insufficient documentation

## 2017-09-09 DIAGNOSIS — R220 Localized swelling, mass and lump, head: Secondary | ICD-10-CM

## 2017-09-09 NOTE — ED Triage Notes (Signed)
Pt reports having bump to underside of tongue into lower jaw that has been ongoing for the last week. Pt reports pain when swallowing.

## 2017-09-09 NOTE — ED Notes (Signed)
Pt feels a small lump on the lower part of her mouth under the tongue that she noticed a week ago. The pain radiates to the jaw and feels sore.

## 2017-09-09 NOTE — ED Provider Notes (Signed)
[facility]-EMERGENCY DEPT Provider Note   CSN: [id number] Arrival date & time: 09/09/17  2053     History   Chief Complaint Chief Complaint  Patient presents with  . tongue pain    HPI Brittany [name] is a 51 y.o. female with a history of nephrolithiasis who presents to the emergency department with a chief complaint of a painful, enlarging "bump" in the floor of the mouth x1 week. She reports associated xerostomia and sore throat. She denies fever, chills, dental pain, congestion, rhinorrhea, or difficulty swallowing. No h/o of similar. No alleviated factors. No treatment prior to arrival.   The history is provided by the patient. No language interpreter was used.    Past Medical History:  Diagnosis Date  . Kidney stones   . Kidney stones   . Renal disorder    kidney stones    Patient Active Problem List   Diagnosis Date Noted  . Dysphagia 04/15/2016  . Throat pain 04/15/2016    Past Surgical History:  Procedure Laterality Date  . APPENDECTOMY    . MASS EXCISION N/A 02/17/2014   Procedure: EXCISION MASS OF SCALP WITH FLAP CLOSURE;  Surgeon: Louisa Second, MD;  Location: [facility];  Service: Plastics;  Laterality: N/A;    OB History    No data available       Home Medications    Prior to Admission medications   Medication Sig Start Date End Date Taking? Authorizing Provider  ibuprofen (ADVIL,MOTRIN) 800 MG tablet Take 1 tablet (800 mg total) by mouth 3 (three) times daily. Patient not taking: Reported on 04/14/2016 01/21/16   Eber Hong, MD  naproxen (NAPROSYN) 500 MG tablet Take 1 tablet (500 mg total) by mouth 2 (two) times daily. 06/14/16   Sam, Ace Gins, PA-C  omeprazole (PRILOSEC) 20 MG capsule Take 1 capsule (20 mg total) by mouth daily. 08/16/16   Linwood Dibbles, MD  polyethylene glycol [facility]) packet Take 17 g by mouth daily. 06/14/16   Sam, Ace Gins, PA-C  sucralfate (CARAFATE) 1 GM/10ML suspension Take 10 mLs (1 g  total) by mouth 4 (four) times daily -  with meals and at bedtime. Patient not taking: Reported on 04/15/2016 04/15/16   Derwood Kaplan, MD    Family History Family History  Problem Relation Age of Onset  . Diabetes Mother   . Diabetes Father     Social History Social History  Substance Use Topics  . Smoking status: Never Smoker  . Smokeless tobacco: Never Used  . Alcohol use No     Allergies   Patient has no known allergies.   Review of Systems Review of Systems  Constitutional: Negative for activity change, chills and fever.  HENT: Positive for sore throat. Negative for rhinorrhea and trouble swallowing.        Sublingual mass Xerostomia  Respiratory: Negative for shortness of breath.   Cardiovascular: Negative for chest pain.  Gastrointestinal: Negative for abdominal pain.  Musculoskeletal: Negative for back pain.  Skin: Negative for rash.     Physical Exam Updated Vital Signs BP (!) 130/98 (BP Location: Left Arm)   Pulse 72   Temp 97.8 F (36.6 C) (Oral)   Resp 15   Ht 5\' 7"  (1.702 m)   Wt 61.2 kg (135 lb)   SpO2 100%   BMI 21.14 kg/m   Physical Exam  Constitutional: No distress.  HENT:  Head: Normocephalic.  Mouth/Throat: Uvula is midline and oropharynx is clear and moist. She does  not have dentures. Oral lesions present. No trismus in the jaw. Normal dentition. No uvula swelling or lacerations. No oropharyngeal exudate, posterior oropharyngeal edema or posterior oropharyngeal erythema.  There is a 2 mm hard, mobile mass near the right sublingual gland. No discharge noted. No left sublingual masses.   Eyes: Conjunctivae are normal.  Neck: Neck supple.  Cardiovascular: Normal rate and regular rhythm.  Exam reveals no gallop and no friction rub.   No murmur heard. Pulmonary/Chest: Effort normal. No respiratory distress.  Abdominal: Soft. She exhibits no distension.  Neurological: She is alert.  Skin: Skin is warm. No rash noted.  Psychiatric: Her  behavior is normal.  Nursing note and vitals reviewed.    ED Treatments / Results  Labs (all labs ordered are listed, but only abnormal results are displayed) Labs Reviewed - No data to display  EKG  EKG Interpretation None       Radiology No results found.  Procedures Procedures (including critical care time)  Medications Ordered in ED Medications  ibuprofen (ADVIL,MOTRIN) tablet 600 mg (not administered)     Initial Impression / Assessment and Plan / ED Course  I have reviewed the triage vital signs and the nursing notes.  Pertinent labs & imaging results that were available during my care of the patient were reviewed by me and considered in my medical decision making (see chart for details).     51 year old female with a history of nephrolithiasis who presents to the emergency department with a sublingual mass x1 week. On exam, a 2mm, hard, mobile mass to the right oral floor. The right sublingual gland was massaged from the posterior aspect of the mouth to the anterior aspect, but the stone was not dislodged. Discussed conservative management options with the patient, including hard lemon candies, massaging the area at home, and warm compresses to the floor of the mouth. Discussed follow up with ENT if her symptoms do not improve in the next week. Strict return precautions given. No acute distress. The patient is safe for discharge at this time.  Final Clinical Impressions(s) / ED Diagnoses   Final diagnoses:  Sialolithiasis  Sublingual gland swelling    New Prescriptions New Prescriptions   No medications on file     Barkley BoardsMcDonald, Daiel Strohecker A, PA-C 09/10/17 0026    Dione BoozeGlick, David, MD 09/10/17 [phone]

## 2017-09-09 NOTE — ED Notes (Signed)
Bed: WTR6 Expected date:  Expected time:  Means of arrival:  Comments: 

## 2017-09-10 MED ORDER — IBUPROFEN 200 MG PO TABS
600.0000 mg | ORAL_TABLET | Freq: Once | ORAL | Status: AC
  Administered 2017-09-10: 600 mg via ORAL
  Filled 2017-09-10: qty 3

## 2017-09-10 NOTE — Discharge Instructions (Signed)
600 mg of ibuprofen may be taken once every 6-8 hours as needed for pain control.  Sucking on hard lemon candies can help to dislodge the stone. You can place a cloth with warm water in the floor of the mouth 3-4 times per day to help dislodge the stone. You can also massage the floor of the mouth from the back to the front to help with your symptoms.   If your symptoms do not improve in the next 4-5 days, you can call the Ear, Nose, and Throat specialist.   If you develop new or worsening symptoms, fever, chills, or thick drainage that looks like pus, please return to the emergency department for reevaluation.

## 2017-09-10 NOTE — ED Notes (Signed)
Pt ambulatory and independent at discharge.  Verbalized understanding of discharge instructions 

## 2017-09-10 NOTE — ED Notes (Signed)
ED PA at bedside

## 2018-03-23 ENCOUNTER — Ambulatory Visit (INDEPENDENT_AMBULATORY_CARE_PROVIDER_SITE_OTHER): Payer: Self-pay | Admitting: Family Medicine

## 2018-03-23 ENCOUNTER — Other Ambulatory Visit: Payer: Self-pay

## 2018-03-23 ENCOUNTER — Encounter: Payer: Self-pay | Admitting: Family Medicine

## 2018-03-23 VITALS — BP 118/68 | HR 71 | Temp 98.3°F | Ht 66.0 in | Wt 130.4 lb

## 2018-03-23 DIAGNOSIS — Z7689 Persons encountering health services in other specified circumstances: Secondary | ICD-10-CM

## 2018-03-23 NOTE — Patient Instructions (Signed)
It was nice meeting you today!  You were seen in clinic to establish care with me as your new primary care doctor.  We reviewed your medical history, medications, surgical and family history.  I would like you to follow-up in 1-2 weeks to discuss additional issues.  Please call clinic if you have any questions.  Be well, Freddrick March, MD

## 2018-03-23 NOTE — Progress Notes (Signed)
   Subjective:   Patient ID: Brittany [name]    DOB: December 07, 1965, 52 y.o. female   MRN: [id number]  CC: establish care   HPI: [name] is a 52 y.o. female who presents to clinic today to establish care with new PCP.  Patient lives in [place] and moved from [place] in 2000.  She states she is divorced and lives at home with her 3 children- 2 daughters and 1 son.  She has no current concerns at this time.    PMH: h/o 2 kidney stones since 2010  Surgical: appendectomy 1975, diagnostic laparoscopy 1990  FHx: both parents with HLD, HTN and T2DM.  H/o kidney stones.  Aunt with cancer, ?type.  Uncle with h/o liver cancer and stroke.   Social: no h/o tobacco use, denies alcohol use or illicit drug use    No medications or known allergies.   ROS: No fever, chills, nausea, vomiting, diarrhea.  No abdominal pain.  Social: never smoker   Medications reviewed. Objective:   BP 118/68   Pulse 71   Temp 98.3 F (36.8 C) (Oral)   Ht  (1.676 m)   Wt 130 lb 6.4 oz (59.1 kg)   SpO2 98%   BMI 21.05 kg/m  Vitals and nursing note reviewed.  General: Thin 52 year old female, NAD HEENT: NCAT, EOMI, PERRLA, MMM, oropharynx clear Neck: supple, nontender, no JVD CV: RRR, no MRG, 2+ pedal pulses Lungs: CTA B, normal effort Abdomen: soft, NTND, positive bowel sounds Skin: warm, dry, no rash noted Extremities: warm and well perfused, normal tone  Assessment & Plan:   52 year old female presents to establish care with primary care provider.  No current concerns at this time.  Medical, surgical, family history and medications reviewed.  She is to follow-up in 1 to 2 weeks time to discuss additional issues.  Health maintenance: -Pap smear at next visit   Freddrick March, MD [facility], PGY-2 03/23/2018 2:26 PM

## 2018-04-06 ENCOUNTER — Other Ambulatory Visit: Payer: Self-pay

## 2018-04-06 ENCOUNTER — Encounter: Payer: Self-pay | Admitting: Family Medicine

## 2018-04-06 ENCOUNTER — Ambulatory Visit (INDEPENDENT_AMBULATORY_CARE_PROVIDER_SITE_OTHER): Payer: Self-pay | Admitting: Family Medicine

## 2018-04-06 VITALS — BP 102/62 | HR 66 | Temp 98.2°F | Ht 66.0 in | Wt 127.6 lb

## 2018-04-06 DIAGNOSIS — R220 Localized swelling, mass and lump, head: Secondary | ICD-10-CM

## 2018-04-06 DIAGNOSIS — N644 Mastodynia: Secondary | ICD-10-CM

## 2018-04-06 DIAGNOSIS — R600 Localized edema: Secondary | ICD-10-CM

## 2018-04-06 DIAGNOSIS — R609 Edema, unspecified: Secondary | ICD-10-CM

## 2018-04-06 NOTE — Patient Instructions (Signed)
It was nice seeing you again today!  We discussed the salivary gland swelling that you have had.  On my exam, it does not seem related to cancer however I am referring you to an ENT surgeon for further evaluation.  They may decide to remove it as it is bothersome to you.  If you have any new or worsening symptoms, please make an appointment to be seen by provider.  You can expect a call within a week or so regarding scheduling this appointment with an ENT doctor.  Regarding your breast pain, I suspect this is due to fibrous glandular tissue.  However, you are due for another screening mammogram.  Please make an appointment by calling the number provided.    Be well, Brittany March MD

## 2018-04-06 NOTE — Progress Notes (Signed)
Subjective:   Patient ID: Brittany [name]    DOB: 24-May-1966, 52 y.o. female   MRN: [id number]  CC: salivary gland swelling, breast pain   HPI: Brittany [name] is a 52 y.o. female who presents to clinic today for salivary gland swelling and breast pain.  Salivary gland swelling  Complaining of R sided mandibular pain and area of enlarged gland.  Painful even when not pressing on it.  She states this has been present for over a year. She has not been able to eat normally because of this pain.  Drinks smoothies and liquids.  If eating something solid she chews it a lot to make it softer.  No changes in smell or taste.  No xerostomia reported.  Bump has been enlarging over last several weeks.  Denies fever, chills, nausea, vomiting.  Pain radiates to R jaw.  No history of tobacco use, no alcohol or illicit drug use.  She does not have history of chewing tobacco.    Breast pain Pt endorses breast pain x 1.5 years.  Has not been taking medications for this.  Describes pain as sharp, does not radiate.  No history of cancer or blood clots.  No nipple discharge or bleeding.  No fever, chills, nausea, vomiting.  No abdominal pain.  Last mammogram was in 2014.   ROS: See HPI for pertinent ROS.  Social: pt is a never smoker  Medications reviewed. Objective:   BP 102/62   Pulse 66   Temp 98.2 F (36.8 C) (Oral)   Ht  (1.676 m)   Wt 127 lb 9.6 oz (57.9 kg)   SpO2 99%   BMI 20.60 kg/m  Vitals and nursing note reviewed.  General: pleasant 52 yo female, NAD  HEENT: NCAT, EOMI, MMM, o/p: small 5 mm hard mobile lump on right floor of mandible (sublingual).  No surrounding erythema, fluctuance or swelling noted.  Neck: supple, no LAD  CV: RRR no MRG  Lungs: CTAB, normal effort  Breasts: symmetric fibrous changes in both upper outer quadrants, left breast normal without mass, skin or nipple changes or axillary nodes.  No palpable nodules.  Skin: warm, dry, no rash Extremities: warm and well  perfused, normal tone  Assessment & Plan:   Sublingual gland swelling Suspect sialolithiasis vs sublingual gland swelling.    R sublingual gland with 5 mm hard mobile mass.  Has been increasing in size per patient.  Conservative management discussed including massaging the area and warm compresses, which has not helped much.   -placed referral to ENT given no improvement  - will continue to monitor   Breast pain, right Patient endorses pain with occasional radiation to R chest wall. Does not appear infectious on exam. Breast exam without distinct lump, noted to have dense breasts with fibrous tissue.  Low suspicion for cardiac etiology as it is reproducible on exam and more consistent with MSK.  Do not suspect PE as no SOB and vitals are stable.   Per chart review, last mammogram 2014 with scattered areas of fibroglandular density.  No evidence of malignancy.  Will evaluate with mammogram as she is due for another screening- Number provided and pt is to schedule this.  -will f/u with results -return precautions discussed    Orders Placed This Encounter  Procedures  . MM DIAG BREAST TOMO BILATERAL    Standing Status:   Future    Standing Expiration Date:   06/07/2019    Order Specific Question:   Reason for Exam (  SYMPTOM  OR DIAGNOSIS REQUIRED)    Answer:   breast pain BILATERAL    Order Specific Question:   Is the patient pregnant?    Answer:   No    Order Specific Question:   Preferred imaging location?    Answer:   [facility]  . Ambulatory referral to ENT    Referral Priority:   Routine    Referral Type:   Consultation    Referral Reason:   Specialty Services Required    Requested Specialty:   Otolaryngology    Number of Visits Requested:   1    Freddrick March, MD [facility], PGY-2 04/11/2018 4:01 PM

## 2018-04-10 ENCOUNTER — Other Ambulatory Visit: Payer: Self-pay | Admitting: Family Medicine

## 2018-04-10 DIAGNOSIS — N644 Mastodynia: Secondary | ICD-10-CM

## 2018-04-11 DIAGNOSIS — R609 Edema, unspecified: Secondary | ICD-10-CM

## 2018-04-11 DIAGNOSIS — R220 Localized swelling, mass and lump, head: Secondary | ICD-10-CM | POA: Insufficient documentation

## 2018-04-11 DIAGNOSIS — N644 Mastodynia: Secondary | ICD-10-CM | POA: Insufficient documentation

## 2018-04-11 NOTE — Assessment & Plan Note (Addendum)
Suspect sialolithiasis vs sublingual gland swelling.    R sublingual gland with 5 mm hard mobile mass.  Has been increasing in size per patient.  Conservative management discussed including massaging the area and warm compresses, which has not helped much.   -placed referral to ENT given no improvement  - will continue to monitor

## 2018-04-11 NOTE — Assessment & Plan Note (Addendum)
Patient endorses pain with occasional radiation to R chest wall. Does not appear infectious on exam. Breast exam without distinct lump, noted to have dense breasts with fibrous tissue.  Low suspicion for cardiac etiology as it is reproducible on exam and more consistent with MSK.  Do not suspect PE as no SOB and vitals are stable.   Per chart review, last mammogram 2014 with scattered areas of fibroglandular density.  No evidence of malignancy.  Will evaluate with mammogram as Brittany [name] is due for another screening- Number provided and pt is to schedule this.  -will f/u with results -return precautions discussed

## 2018-05-10 ENCOUNTER — Encounter (HOSPITAL_COMMUNITY): Payer: Self-pay

## 2018-05-10 ENCOUNTER — Other Ambulatory Visit (HOSPITAL_COMMUNITY): Payer: Self-pay | Admitting: *Deleted

## 2018-05-10 ENCOUNTER — Emergency Department (HOSPITAL_COMMUNITY)
Admission: EM | Admit: 2018-05-10 | Discharge: 2018-05-10 | Disposition: A | Discharge status: Home / Self Care | Payer: Self-pay | Attending: Emergency Medicine | Admitting: Emergency Medicine

## 2018-05-10 DIAGNOSIS — N644 Mastodynia: Secondary | ICD-10-CM | POA: Insufficient documentation

## 2018-05-10 NOTE — ED Triage Notes (Signed)
Pt complains of right breast pain for one year, her primary doctor told her recently that she felt lumps on exam and made her a mammogram appt but not until the end of July Pt says the pain is worse and she's scared to wait that long

## 2018-05-10 NOTE — ED Provider Notes (Signed)
[facility]-EMERGENCY DEPT Provider Note   CSN: [id number] Arrival date & time: 05/10/18  2055     History   Chief Complaint Chief Complaint  Patient presents with  . Breast Pain    HPI Brittany [name] is a 52 y.o. female.  HPI  The patient is a 52 year old female, Brittany [name] has a known history of kidney stones, Brittany [name] also has a history of right-sided breast pain, Brittany [name] has had several evaluations for this over time but recently was told that Brittany [name] might have a mass on her breast and was scheduled for a mammogram visit at the end of July.  Since that time Brittany [name] has had progressive anxiety regarding this issue and is concerned about having to wait a month to have that done.  Brittany [name] denies any weight loss, denies any lumps in her axilla area, has no fevers or chills and no shortness of breath.  Her symptoms are persistent, nothing seems to make this better, worse with palpation.  Past Medical History:  Diagnosis Date  . Kidney stones   . Kidney stones   . Renal disorder    kidney stones    Patient Active Problem List   Diagnosis Date Noted  . Sublingual gland swelling 04/11/2018  . Breast pain, right 04/11/2018  . Dysphagia 04/15/2016  . Throat pain 04/15/2016    Past Surgical History:  Procedure Laterality Date  . APPENDECTOMY    . MASS EXCISION N/A 02/17/2014   Procedure: EXCISION MASS OF SCALP WITH FLAP CLOSURE;  Surgeon: Louisa SecondGerald Truesdale, MD;  Location: [facility];  Service: Plastics;  Laterality: N/A;     OB History   None      Home Medications    Prior to Admission medications   Not on File    Family History Family History  Problem Relation Age of Onset  . Diabetes Mother   . Diabetes Father     Social History Social History   Tobacco Use  . Smoking status: Never Smoker  . Smokeless tobacco: Never Used  Substance Use Topics  . Alcohol use: No  . Drug use: No     Allergies   Patient has no known allergies.   Review of  Systems Review of Systems  All other systems reviewed and are negative.    Physical Exam Updated Vital Signs BP 130/88 (BP Location: Left Arm)   Pulse 72   Temp 98.2 F (36.8 C) (Oral)   Resp 16   Ht 5\' 6"  (1.676 m)   Wt 58.5 kg (129 lb)   SpO2 98%   BMI 20.82 kg/m   Physical Exam  Constitutional: Brittany [name] appears well-developed and well-nourished. No distress.  HENT:  Head: Normocephalic and atraumatic.  Mouth/Throat: Oropharynx is clear and moist. No oropharyngeal exudate.  Eyes: Pupils are equal, round, and reactive to light. Conjunctivae and EOM are normal. Right eye exhibits no discharge. Left eye exhibits no discharge. No scleral icterus.  Neck: Normal range of motion. Neck supple. No JVD present. No thyromegaly present.  Cardiovascular: Normal rate, regular rhythm, normal heart sounds and intact distal pulses. Exam reveals no gallop and no friction rub.  No murmur heard. Pulmonary/Chest: Effort normal and breath sounds normal. No respiratory distress. Brittany [name] has no wheezes. Brittany [name] has no rales.  Breast exam performed with chaperone present, patient has a slight lumpiness to the right breast, there is no defined discrete mass and there is no axillary lymphadenopathy.  Left breast appears normal, there is no asymmetry, no  redness, no peau d'orange of the skin or other rashes  Abdominal: Soft. Bowel sounds are normal. Brittany [name] exhibits no distension and no mass. There is no tenderness.  Musculoskeletal: Normal range of motion. Brittany [name] exhibits no edema or tenderness.  Lymphadenopathy:    Brittany [name] has no cervical adenopathy.  Neurological: Brittany [name] is alert. Coordination normal.  Skin: Skin is warm and dry. No rash noted. No erythema.  Psychiatric: Brittany [name] has a normal mood and affect. Her behavior is normal.  Nursing note and vitals reviewed.    ED Treatments / Results  Labs (all labs ordered are listed, but only abnormal results are displayed) Labs Reviewed - No data to  display  EKG None  Radiology No results found.  Procedures Procedures (including critical care time)  Medications Ordered in ED Medications - No data to display   Initial Impression / Assessment and Plan / ED Course  I have reviewed the triage vital signs and the nursing notes.  Pertinent labs & imaging results that were available during my care of the patient were reviewed by me and considered in my medical decision making (see chart for details).    The patient is clearly nervous about having to wait a month however at this time we do not do mammograms in the emergency department, I will recommend that Brittany [name] follow-up closely with PCP or try to expedite her mammogram by calling for openings.  Patient agreeable  Final Clinical Impressions(s) / ED Diagnoses   Final diagnoses:  Breast pain, right    ED Discharge Orders    None       Eber Hong, MD 05/10/18 2200

## 2018-05-16 ENCOUNTER — Other Ambulatory Visit: Payer: Self-pay | Admitting: Otolaryngology

## 2018-05-16 DIAGNOSIS — R1314 Dysphagia, pharyngoesophageal phase: Secondary | ICD-10-CM

## 2018-06-14 ENCOUNTER — Encounter (HOSPITAL_COMMUNITY): Payer: Self-pay

## 2018-06-14 ENCOUNTER — Ambulatory Visit
Admission: RE | Admit: 2018-06-14 | Discharge: 2018-06-14 | Disposition: A | Discharge status: Home / Self Care | Payer: No Typology Code available for payment source | Source: Ambulatory Visit | Attending: Obstetrics and Gynecology | Admitting: Obstetrics and Gynecology

## 2018-06-14 ENCOUNTER — Ambulatory Visit (HOSPITAL_COMMUNITY)
Admission: RE | Admit: 2018-06-14 | Discharge: 2018-06-14 | Disposition: A | Discharge status: Home / Self Care | Payer: Self-pay | Source: Ambulatory Visit | Attending: Obstetrics and Gynecology | Admitting: Obstetrics and Gynecology

## 2018-06-14 VITALS — BP 126/75 | Ht 66.0 in | Wt 133.8 lb

## 2018-06-14 DIAGNOSIS — N644 Mastodynia: Secondary | ICD-10-CM

## 2018-06-14 DIAGNOSIS — N6311 Unspecified lump in the right breast, upper outer quadrant: Secondary | ICD-10-CM

## 2018-06-14 DIAGNOSIS — Z01419 Encounter for gynecological examination (general) (routine) without abnormal findings: Secondary | ICD-10-CM

## 2018-06-14 NOTE — Progress Notes (Signed)
Complaints of right breast pain x 1 year and a right breast lump x 7 weeks. Patient states the pain comes and goes. Patient rates the pain at a 7 out of 10.  Pap Smear: Pap smear completed today. Last Pap smear was in 2012 and normal per patient. Per patient has no history of an abnormal Pap smear. No Pap smear results are in Epic.  Physical exam: Breasts Breasts symmetrical. No skin abnormalities bilateral breasts. No nipple retraction bilateral breasts. No nipple discharge bilateral breasts. No lymphadenopathy. No lumps palpated left breast. Palpated a mobile lump within the right breast at 10 o'clock 6 cm from the nipple. Complaints of diffuse right breast pain on exam. [facility] mamm[facility] and right breast ultrasound. Appointment scheduled for Thursday, June 14, 2018 at 0850.        Pelvic/Bimanual   Ext Genitalia No lesions, no swelling and no discharge observed on external genitalia.         Vagina Vagina pink and normal texture. No lesions or discharge observed in vagina.          Cervix Cervix is present. Cervix pink and of normal texture. No discharge observed.     Uterus Uterus is present and palpable. Uterus in normal position and normal size.        Adnexae Bilateral ovaries present and palpable. No tenderness on palpation.         Rectovaginal No rectal exam completed today since patient had no rectal complaints. No skin abnormalities observed on exam.    Smoking History: Patient has never smoked.  Patient Navigation: Patient education provided. Access to services provided for patient through [facility] program.   Colorectal Cancer Screening: Per patient has never had a colonoscopy completed. No complaints today. FIT Test given to patient to complete and return to [facility].  Breast and Cervical Cancer Risk Assessment: Patient has no family history of breast cancer, known genetic mutations, or radiation treatment to  the chest before age 52. Patient has no history of cervical dysplasia, immunocompromised, or DES exposure in-utero.    Risk Assessment    Risk Scores      06/14/2018   Last edited by: Priscille HeidelbergBrannock, Beadie Matsunaga P, RN   5-year risk: 1.1 %   Lifetime risk: 8.4 %

## 2018-06-14 NOTE — Addendum Note (Signed)
Encounter addended by: Lynnell DikeHolland, Darrall Strey H, LPN on: 4/09/81197/25/2019 9:24 AM  Actions taken: Order list changed

## 2018-06-14 NOTE — Patient Instructions (Signed)
Explained breast self awareness with Leim FabrySawsan Ells. Let patient know [facility] will cover Pap smears and HPV typing every 5 years unless has a history of abnormal Pap smears. [facility] for a diagnostic mamm[facility] and right breast ultrasound. Appointment scheduled for Thursday, June 14, 2018 at 0850. Let patient know will follow up with her within the next couple weeks with results of Pap smear by letter or phone. Kayliana Smallman verbalized understanding.  Kariah Loredo, Kathaleen Maserhristine Poll, RN 8:30 AM

## 2018-06-15 ENCOUNTER — Encounter (HOSPITAL_COMMUNITY): Payer: Self-pay | Admitting: *Deleted

## 2018-06-15 ENCOUNTER — Ambulatory Visit
Admission: RE | Admit: 2018-06-15 | Discharge: 2018-06-15 | Disposition: A | Discharge status: Home / Self Care | Payer: No Typology Code available for payment source | Source: Ambulatory Visit | Attending: Otolaryngology | Admitting: Otolaryngology

## 2018-06-15 DIAGNOSIS — R1314 Dysphagia, pharyngoesophageal phase: Secondary | ICD-10-CM

## 2018-06-20 ENCOUNTER — Other Ambulatory Visit (HOSPITAL_COMMUNITY): Payer: Self-pay | Admitting: *Deleted

## 2018-06-20 ENCOUNTER — Other Ambulatory Visit: Payer: Self-pay

## 2018-06-20 DIAGNOSIS — Z0000 Encounter for general adult medical examination without abnormal findings: Secondary | ICD-10-CM

## 2018-06-20 LAB — CYTOLOGY - PAP
DIAGNOSIS: NEGATIVE
HPV (WINDOPATH): NOT DETECTED

## 2018-06-20 NOTE — Progress Notes (Signed)
Lipid

## 2018-06-21 NOTE — Addendum Note (Signed)
Addended by: Donia GuilesPLEASANT, Kamron Vanwyhe on: 06/21/2018 09:41 AM   Modules accepted: Orders

## 2018-06-21 NOTE — Addendum Note (Signed)
Addended by: Anchor Dwan on: 06/21/2018 09:41 AM   Modules accepted: Orders  

## 2018-06-22 ENCOUNTER — Inpatient Hospital Stay: Payer: Self-pay

## 2018-06-22 ENCOUNTER — Inpatient Hospital Stay: Payer: Self-pay | Attending: Obstetrics and Gynecology | Admitting: *Deleted

## 2018-06-22 VITALS — BP 130/70 | Ht 65.0 in | Wt 129.7 lb

## 2018-06-22 DIAGNOSIS — Z0000 Encounter for general adult medical examination without abnormal findings: Secondary | ICD-10-CM

## 2018-06-22 LAB — LIPID PANEL
Cholesterol: 202 mg/dL — ABNORMAL HIGH (ref 0–200)
HDL: 97 mg/dL (ref >40)
LDL Cholesterol: 100 mg/dL — ABNORMAL HIGH (ref 0–99)
TRIGLYCERIDES: 25 mg/dL (ref <150)
Total CHOL/HDL Ratio: 2.1 RATIO
VLDL: 5 mg/dL (ref 0–40)

## 2018-06-22 LAB — HEMOGLOBIN A1C
Hgb A1c MFr Bld: 5.4 % (ref 4.8–5.6)
Mean Plasma Glucose: 108.28 mg/dL

## 2018-06-22 NOTE — Progress Notes (Signed)
[facility] initial screening  Clinical Measurement:  Height:  65in Weight: 129.7lb  Blood Pressure:  132/92 Blood Pressure #2:  130/70Fasting Labs Drawn Today, will review with patient when they result.  Medical History:  Patient states that she has not been diagnosed with high cholesterol, high blood pressure, diabetes or heart disease.  Medications:  Patients states she is not taking any medications for high cholesterol, high blood pressure or diabetes.  She is not taking aspirin daily to prevent heart attack or stroke.    Blood pressure, self measurement:  Patients states she does not measure blood pressure at home.    Nutrition:  Patient states she eats 2 cups of fruit and 2 cups of vegetables in an average day.  Patient states she does  eat fish regularly, she eats about a half a serving of whole grains daily. She drinks less than 36 ounces of beverages with added sugar weekly.  She is currently watching her sodium intake.  She has not had any drinks containing alcohol in the last seven days.    Physical activity:  Patient states that she gets 135 minutes of moderate exercise in a week.  She gets 135 minutes of vigorous exercise per week.    Smoking status:  Patient states she has never smoked and is not around any smokers.    Quality of life:  Patient states that she has had 0 bad physical days out of the last 30 days. In the last 2 weeks, she has had 0 days that she has felt down or depressed. She has had 0 days in the last 2 weeks that she has had little interest or pleasure in doing things.  Risk reduction and counseling:  Patient states she wants to  increase fruit and vegetable intake.  I encouraged her to continue with current exercise regimen and increase vegetable, fruit intake and water consumption.  Navigation:  I will notify patient of lab results.  Patient is aware of 2 more health coaching sessions and a follow up.

## 2018-06-23 LAB — SPECIMEN STATUS REPORT

## 2018-06-23 LAB — FECAL OCCULT BLOOD, IMMUNOCHEMICAL: FECAL OCCULT BLD: NEGATIVE

## 2018-06-25 ENCOUNTER — Telehealth (HOSPITAL_COMMUNITY): Payer: Self-pay | Admitting: *Deleted

## 2018-06-25 NOTE — Telephone Encounter (Signed)
Health coaching 2  Labs-cholesterol 202, LDL 100, triglycerides 25, HDL 97, hemoglobin A1C 5.4, mean plasma glucose 108.28   Patent is aware and understands these lab results.  Goals- Patient states she exercises a total of 270 minutes (moderate and vigorous) each week.  Patient states she eats mostly fish, fruits and vegetables. Her goal is to increase whole grain consumption.Patient states she drinks 8 to 10 glasses of water daily.  Navigation: Patient is aware of 1 more health coaching sessions and a follow up.  Time- 10 minutes

## 2018-06-26 ENCOUNTER — Encounter (HOSPITAL_COMMUNITY): Payer: Self-pay

## 2018-06-27 ENCOUNTER — Ambulatory Visit: Payer: Self-pay

## 2018-06-27 ENCOUNTER — Encounter (HOSPITAL_COMMUNITY): Payer: Self-pay | Admitting: *Deleted

## 2018-06-27 NOTE — Progress Notes (Signed)
Letter mailed to patient with negative pap smear results. HPV was negative. Next pap smear due in five years. 

## 2018-07-06 ENCOUNTER — Ambulatory Visit: Payer: Self-pay | Admitting: Family Medicine

## 2018-07-25 NOTE — Progress Notes (Signed)
  Subjective:   Patient ID: Brittany [name]    DOB: 04-30-1966, 52 y.o. female   MRN: [id number]  Brittany [name] is a 52 y.o. female with a history of kidney stones and dysphagia here for   Flank pain Patient presents with 2 week h/o L flank pain, feels similar to prior kidney stones.  - CT Abd/Pelvis 2014 with multiple nonobstructing intrarenal stones bilaterally. - states she drinks a lot of coffee, about 5 cups in the morning and about 2 bottles of water throughout the day. - no dysuria, hematuria, frequency. No difficulty with urinating. - mom and dad both have had multiple kidney stones. No known FH of kidney diseases. - reports never had to have lithotripsy - had stones analyzed previously but can't remember what they were made of. - current pain 7/10 - no abnormal vaginal discharge, bleeding, fevers, N/V/D, abdominal pain. - sometimes wakes up from sleep - using advil for pain  Review of Systems:  Per HPI.  PMFSH, medications and smoking status reviewed.  Objective:   BP 104/72   Pulse 75   Temp 98.2 F (36.8 C) (Oral)   Ht 5\' 5"  (1.651 m)   Wt 133 lb (60.3 kg)   LMP 12/30/2013   BMI 22.13 kg/m  Vitals and nursing note reviewed.  General: well nourished, well developed, in no acute distress with non-toxic appearance CV: regular rate and rhythm without murmurs, rubs, or gallops, no lower extremity edema Lungs: clear to auscultation bilaterally with normal work of breathing Abdomen: soft, non-tender, non-distended, no masses or organomegaly palpable, normoactive bowel sounds. +CVA tenderness. Skin: warm, dry, no rashes or lesions Extremities: warm and well perfused, normal tone MSK: ROM grossly intact, strength intact, gait normal Neuro: Alert and oriented, speech normal  Assessment & Plan:   Kidney stone Pain and U/A consistent with L sided kidney stone. Will defer imaging at this time as she does not have insurance. Will prescribe Flomax, encouraged to continue  advil as needed for pain. If she does not pass the stone in a few days, encouraged to return for possible imaging at that time. Will obtain BMP today.  Orders Placed This Encounter  Procedures  . Basic Metabolic Panel  . POCT urinalysis dipstick  . POCT UA - Microscopic Only   Meds ordered this encounter  Medications  . tamsulosin (FLOMAX) 0.4 MG CAPS capsule    Sig: Take 1 capsule (0.4 mg total) by mouth daily.    Dispense:  30 capsule    Refill:  0   Ellwood Dense, DO PGY-2, [facility] 07/27/2018 2:46 PM

## 2018-07-27 ENCOUNTER — Encounter: Payer: Self-pay | Admitting: Family Medicine

## 2018-07-27 ENCOUNTER — Ambulatory Visit (INDEPENDENT_AMBULATORY_CARE_PROVIDER_SITE_OTHER): Payer: Self-pay | Admitting: Family Medicine

## 2018-07-27 ENCOUNTER — Other Ambulatory Visit: Payer: Self-pay

## 2018-07-27 VITALS — BP 104/72 | HR 75 | Temp 98.2°F | Ht 65.0 in | Wt 133.0 lb

## 2018-07-27 DIAGNOSIS — N200 Calculus of kidney: Secondary | ICD-10-CM | POA: Insufficient documentation

## 2018-07-27 DIAGNOSIS — M545 Low back pain: Secondary | ICD-10-CM

## 2018-07-27 LAB — POCT URINALYSIS DIP (MANUAL ENTRY)
Bilirubin, UA: NEGATIVE
GLUCOSE UA: NEGATIVE mg/dL
Ketones, POC UA: NEGATIVE mg/dL
Leukocytes, UA: NEGATIVE
Nitrite, UA: NEGATIVE
Protein Ur, POC: NEGATIVE mg/dL
SPEC GRAV UA: 1.02 (ref 1.010–1.025)
UROBILINOGEN UA: 0.2 U/dL
pH, UA: 7 (ref 5.0–8.0)

## 2018-07-27 LAB — POCT UA - MICROSCOPIC ONLY

## 2018-07-27 MED ORDER — TAMSULOSIN HCL 0.4 MG PO CAPS: 0.4000 mg | ORAL_CAPSULE | Freq: Every day | ORAL | 0 refills | Status: AC

## 2018-07-27 NOTE — Assessment & Plan Note (Signed)
Pain and U/A consistent with L sided kidney stone. Will defer imaging at this time as she does not have insurance. Will prescribe Flomax, encouraged to continue advil as needed for pain. If she does not pass the stone in a few days, encouraged to return for possible imaging at that time. Will obtain BMP today.

## 2018-07-27 NOTE — Patient Instructions (Signed)
It was great to see you!  Our plans for today:  - We are prescribing a medication to help with pain and relaxation of your ureter to help pass the kidney stone. - If you do not pass the stone in a few days or so, be sure to come back and we may need to do imaging at that time. - It is imperative to drink lots of water, not just when you have a kidney stone but all the time to help prevent them.  Take care and seek immediate care sooner if you develop any concerns.   Dr. [facility]

## 2018-07-28 LAB — BASIC METABOLIC PANEL
BUN/Creatinine Ratio: 20 (ref 9–23)
BUN: 15 mg/dL (ref 6–24)
CALCIUM: 9.1 mg/dL (ref 8.7–10.2)
CO2: 23 mmol/L (ref 20–29)
CREATININE: 0.74 mg/dL (ref 0.57–1.00)
Chloride: 104 mmol/L (ref 96–106)
GFR calc non Af Amer: 93 mL/min/{1.73_m2} (ref >59)
GFR, EST AFRICAN AMERICAN: 107 mL/min/{1.73_m2} (ref >59)
Glucose: 85 mg/dL (ref 65–99)
Potassium: 3.6 mmol/L (ref 3.5–5.2)
Sodium: 145 mmol/L — ABNORMAL HIGH (ref 134–144)

## 2018-08-06 ENCOUNTER — Encounter: Payer: Self-pay | Admitting: *Deleted

## 2018-09-18 ENCOUNTER — Ambulatory Visit: Payer: Self-pay | Admitting: Family Medicine

## 2018-09-18 NOTE — Progress Notes (Deleted)
   CC: ***  HPI  Flank pain - Seen on 07/27/18 for c/w flank pain, hx of kidney stones on CT in 2014. *** since then. *** flomax.   Sore throat - *** sick contacts. Present since ***.   ROS: ***Denies CP, SOB, abdominal pain, dysuria, changes in BMs.   CC, SH/smoking status, and VS noted  Objective: LMP 12/30/2013  Gen: NAD, alert, cooperative, and pleasant.*** HEENT: NCAT, EOMI, PERRL CV: RRR, no murmur Resp: CTAB, no wheezes, non-labored Abd: SNTND, BS present, no guarding or organomegaly Ext: No edema, warm Neuro: Alert and oriented, Speech clear, No gross deficits  Assessment and plan:  No problem-specific Assessment & Plan notes found for this encounter.   No orders of the defined types were placed in this encounter.   No orders of the defined types were placed in this encounter.   Health Maintenance reviewed - {health maintenance:[id number]}.  Loni Muse, MD, PGY3 09/18/2018 7:30 AM

## 2018-10-09 ENCOUNTER — Other Ambulatory Visit: Payer: Self-pay

## 2018-10-09 ENCOUNTER — Encounter: Payer: Self-pay | Admitting: Family Medicine

## 2018-10-09 ENCOUNTER — Ambulatory Visit (INDEPENDENT_AMBULATORY_CARE_PROVIDER_SITE_OTHER): Payer: Self-pay | Admitting: Family Medicine

## 2018-10-09 VITALS — BP 110/72 | HR 67 | Temp 97.9°F | Ht 65.0 in | Wt 135.6 lb

## 2018-10-09 DIAGNOSIS — R1310 Dysphagia, unspecified: Secondary | ICD-10-CM

## 2018-10-09 DIAGNOSIS — K219 Gastro-esophageal reflux disease without esophagitis: Secondary | ICD-10-CM

## 2018-10-09 DIAGNOSIS — R220 Localized swelling, mass and lump, head: Secondary | ICD-10-CM

## 2018-10-09 MED ORDER — PANTOPRAZOLE SODIUM 40 MG PO TBEC: 40.0000 mg | DELAYED_RELEASE_TABLET | Freq: Every day | ORAL | 3 refills | Status: AC

## 2018-10-09 NOTE — Progress Notes (Signed)
Subjective:  Brittany [name] is a 52 y.o. female who presents to the [facility] today with a chief complaint of trouble swallowing and a bump on her head.   HPI: Dysphasia: Ms. Brittany [name] has a medical history significant for dysphagia and laryngopharyngeal reflux who came to be seen today for continued trouble swallowing.  For the past several years now, she has had difficulty swallowing due to the sensation of a mass in her mouth that interferes with her ability to swallow.  She currently modifies her diet and eats very little pizza/bread due to difficulty swallowing.  During her meals, she does need to be conscious to take in a lot of fluids to help her swallow.  In addition to her trouble swallowing she notes that she does have some mild jaw pain on both sides of her face.  The jaw pain has also been there for the past several years.  She also notes occasional shortness of breath at night.  She associated this shortness of breath primarily during times of perseveration on her difficulty swallowing.  Since she began having trouble swallowing, she has been seen multiple times here in the clinic and been seen by ENT twice in addition to being seen in the emergency department once.  She has had 2 barium swallows which have been unremarkable.  Her last note from ENT does not remark on any specific pathology that may be interfering with her swallowing although it did note salivary gland hypertrophy.  She denies fever, weight loss, palpitations, changes in bowel movements, changes in appetite.  Lump on head: Medical history significant for small cyst removal from her head several years ago.  About 2 months ago, she began to notice a small lump on top of her head.  This lump is not painful or tender to the touch.  Though it is small, she thinks it has been growing larger since she first noticed it.  She wonders if it may be a new cyst that may need to be removed.   Objective:  Physical Exam: BP 110/72   Pulse  67   Temp 97.9 F (36.6 C) (Oral)   Ht 5\' 5"  (1.651 m)   Wt 135 lb 9.6 oz (61.5 kg)   LMP 12/30/2013   SpO2 99%   BMI 22.57 kg/m    Gen: NAD, resting comfortably while sitting in the chair in the clinic Neck: No cervical lymphadenopathy appreciated, mild tenderness to palpation of her thyroid cartilage, no thyromegaly appreciated.   Mouth: Moist mucous membranes of her oropharynx. 0.5cm x 0.5 cm nodule noted beneath her tongue to the right upper frenulum.  Started nodule was soft and mobile and nontender to palpation. Head: Small, mobile, half centimeter by quarter centimeter papule was noted on her parietal scalp.  There were no skin changes evident on or around this papule, it is covered by healthy normal-appearing skin with appropriately dense hair follicles.  No hyper or hypopigmentation is present, it is not erythematous, it is nontender to palpation. CV: RRR with no murmurs appreciated Pulm: normal respiratory effort on room air, CTAB with no crackles, wheezes, or rhonchi Neuro: cranial nerves grossly intact  No results found for this or any previous visit (from the past 72 hour(s)).   Assessment/Plan:  Dysphagia Her previous 2 swallow studies have been unremarkable and have successfully ruled out Meckel's diverticulum, achalasia, esophageal dysmotility.  The last ENT note mentioned only the salivary gland hypertrophy.  Her physical exam is significant for salivary gland hypertrophy  with no other obvious reason for dysphasia.  There is no evidence for thyroid enlargement, thyroglossal duct cyst, oral masses.  Differential diagnosis at this time includes but is not limited to: Esophageal webbing, esophagitis, GERD, esophageal strictures, Barrett's esophagus, malignancy. -Protonix prescribed to help with potential GERD (patient noted that these have not helped in the past) -Referral to GI for further work-up/possible endoscopy  Head lump With a previous history of epidermal cysts,  this may possibly be a new cyst.  It is not concerning for possible neoplasm.  We will continue to watch and wait with possible future removal.

## 2018-10-09 NOTE — Assessment & Plan Note (Addendum)
Her previous 2 swallow studies have been unremarkable and have successfully ruled out Meckel's diverticulum, achalasia, esophageal dysmotility.  The last ENT note mentioned only the salivary gland hypertrophy.  Her physical exam is significant for salivary gland hypertrophy with no other obvious reason for dysphasia.  There is no evidence for thyroid enlargement, thyroglossal duct cyst, oral masses.  Differential diagnosis at this time includes but is not limited to: Esophageal webbing, esophagitis, GERD, esophageal strictures, Barrett's esophagus, malignancy. -Protonix prescribed to help with potential GERD (patient noted that these have not helped in the past) -Referral to GI for further work-up/possible endoscopy

## 2018-10-09 NOTE — Patient Instructions (Signed)
Thanks for coming in today.  Here's a quick summary of what we talked about:  1) The bump on your head: We are going to watch it for now.  It does not appear to be dangerous and may likely be another cyst although it is probably too small to remove right now.  2) Regarding your trouble swallowing: We are going to start you on a medication that may help if this is related to heartburn.  We are also going to have you see a stomach doctor who will determine if it is a good idea to look down your throat with a camera.

## 2018-10-09 NOTE — Assessment & Plan Note (Signed)
With a previous history of epidermal cysts, this may possibly be a new cyst.  It is not concerning for possible neoplasm.  We will continue to watch and wait with possible future removal.

## 2018-10-17 ENCOUNTER — Ambulatory Visit: Payer: Self-pay | Admitting: Gastroenterology

## 2018-10-20 ENCOUNTER — Telehealth: Payer: Self-pay

## 2018-10-22 ENCOUNTER — Telehealth: Payer: Self-pay

## 2018-10-22 NOTE — Telephone Encounter (Signed)
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

## 2018-10-23 ENCOUNTER — Telehealth: Payer: Self-pay

## 2018-10-23 NOTE — Telephone Encounter (Signed)
Health Coaching 3  Current:  Patient states that she is eating fruit (banana) one time/week and vegetables (broccoli, lettuce, cucumber, carrots) one time/week.  Patient states that she is eating 2 servings of whole grains (oatmeal) per week.  Patient states that she is eating fish and shrimp often, usually baked and sometimes fried.  Patient states that so far as sugar drinks, she drinks skim milk with honey and chia seeds once/day in the evening to avoid drinking a cup of coffee.  Patient states that she drinks 1-2 bottles of water per day.  Patient states that for physical activity, she walks a lot on her job and she walks the dog 20 minutes/day, 4 days/week.    New Goal:    Goal #1: Patient states that she wants to stop eating sweets.  Patient will plan to do this for one week (December 4 - 11, 2019).  Barrier(s) to reaching goal:  Patient states that when she drinks coffee, she craves sweets.  Strategies to overcome barrier(s):  Patient states that she is planning to reduce her coffee intake.  Confidence Level for achieving goal (1-10):  Patient states that her confidence level for achieving her goal is 10.  Goal #2: Patient states that her goal is to reduce her coffee intake to 3 cups/day.  Patient will plan to do this for one week (December 4-11, 2019).  Barrier(s) to reaching goal:  Patient states that she is not aware of any barriers that may keep her from achieving her goal.  Strategies to overcome barrier(s):  N/A  Confidence Level for achieving goal (1-10):  Patient states that her confidence level for achieving her goal is 7.  Navigation:  Patient is aware of a follow-up session, said we may call her anytime.  Time:  34 minutes

## 2019-02-15 ENCOUNTER — Other Ambulatory Visit: Payer: Self-pay

## 2019-02-15 ENCOUNTER — Emergency Department (HOSPITAL_COMMUNITY): Payer: Self-pay

## 2019-02-15 ENCOUNTER — Emergency Department (HOSPITAL_COMMUNITY)
Admission: EM | Admit: 2019-02-15 | Discharge: 2019-02-15 | Disposition: A | Discharge status: Home / Self Care | Payer: Self-pay | Attending: Emergency Medicine | Admitting: Emergency Medicine

## 2019-02-15 ENCOUNTER — Encounter (HOSPITAL_COMMUNITY): Payer: Self-pay | Admitting: Emergency Medicine

## 2019-02-15 DIAGNOSIS — Z79899 Other long term (current) drug therapy: Secondary | ICD-10-CM | POA: Insufficient documentation

## 2019-02-15 DIAGNOSIS — Y9289 Other specified places as the place of occurrence of the external cause: Secondary | ICD-10-CM | POA: Insufficient documentation

## 2019-02-15 DIAGNOSIS — Y9389 Activity, other specified: Secondary | ICD-10-CM | POA: Insufficient documentation

## 2019-02-15 DIAGNOSIS — M25531 Pain in right wrist: Secondary | ICD-10-CM | POA: Insufficient documentation

## 2019-02-15 DIAGNOSIS — Y990 Civilian activity done for income or pay: Secondary | ICD-10-CM | POA: Insufficient documentation

## 2019-02-15 DIAGNOSIS — W010XXA Fall on same level from slipping, tripping and stumbling without subsequent striking against object, initial encounter: Secondary | ICD-10-CM | POA: Insufficient documentation

## 2019-02-15 MED ORDER — IBUPROFEN 800 MG PO TABS: 800.0000 mg | ORAL_TABLET | Freq: Three times a day (TID) | ORAL | 0 refills | Status: AC

## 2019-02-15 NOTE — ED Notes (Signed)
Pt given and verbalized understanding of d/c instructions. Pt unable to sign d/c due to injury. Told to follow up with pcp and return if s/s worsen. Placed in splint. No further distress or questions at this time

## 2019-02-15 NOTE — ED Provider Notes (Signed)
[facility]   CSN: [id number] Arrival date & time: 02/15/19  2141    History   Chief Complaint Chief Complaint  Patient presents with  . Wrist Injury    HPI Brittany [name] is a 53 y.o. female.      Wrist Injury    53 y.o. F with hx of kidney stones, presenting to the ED after a fall.  Patient reports she works at a [employer] and stepped outside to get some fresh air and slipped on some rocks and fell.  Tried to break her fall with right hand.  She denies head injury or LOC.  States she has intense pain of the right wrist, most along radial aspect.  She denies numbness or tingling.  She is right hand dominant.  No meds PTA.  Past Medical History:  Diagnosis Date  . Kidney stones   . Kidney stones   . Renal disorder    kidney stones    Patient Active Problem List   Diagnosis Date Noted  . Head lump 10/09/2018  . Kidney stone 07/27/2018  . Sublingual gland swelling 04/11/2018  . Breast pain, right 04/11/2018  . Laryngopharyngeal reflux (LPR) 05/20/2016  . Dysphagia 04/15/2016  . Throat pain 04/15/2016    Past Surgical History:  Procedure Laterality Date  . APPENDECTOMY    . MASS EXCISION N/A 02/17/2014   Procedure: EXCISION MASS OF SCALP WITH FLAP CLOSURE;  Surgeon: Louisa Second, MD;  Location: [facility];  Service: Plastics;  Laterality: N/A;     OB History    Gravida  7   Para      Term      Preterm      AB  4   Living  3     SAB  4   TAB      Ectopic      Multiple      Live Births  3            Home Medications    Prior to Admission medications   Medication Sig Start Date End Date Taking? Authorizing Provider  pantoprazole (PROTONIX) 40 MG tablet Take 1 tablet (40 mg total) by mouth daily. 10/09/18   Mirian Mo, MD  tamsulosin (FLOMAX) 0.4 MG CAPS capsule Take 1 capsule (0.4 mg total) by mouth daily. 07/27/18   Ellwood Dense, DO    Family History  Family History  Problem Relation Age of Onset  . Diabetes Mother   . Diabetes Father     Social History Social History   Tobacco Use  . Smoking status: Never Smoker  . Smokeless tobacco: Never Used  Substance Use Topics  . Alcohol use: No  . Drug use: No     Allergies   Patient has no known allergies.   Review of Systems Review of Systems  Musculoskeletal: Positive for arthralgias.  All other systems reviewed and are negative.    Physical Exam Updated Vital Signs BP 131/82 (BP Location: Left Arm)   Pulse 81   Temp 98.4 F (36.9 C) (Oral)   Resp 16   Ht 5\' 7"  (1.702 m)   Wt 60.3 kg   LMP 12/30/2013   SpO2 100%   BMI 20.83 kg/m   Physical Exam Vitals signs and nursing note reviewed.  Constitutional:      Appearance: She is well-developed.  HENT:     Head: Normocephalic and atraumatic.  Eyes:     Conjunctiva/sclera: Conjunctivae normal.  Pupils: Pupils are equal, round, and reactive to light.  Neck:     Musculoskeletal: Normal range of motion.  Cardiovascular:     Rate and Rhythm: Normal rate and regular rhythm.     Heart sounds: Normal heart sounds.  Pulmonary:     Effort: Pulmonary effort is normal.     Breath sounds: Normal breath sounds.  Abdominal:     General: Bowel sounds are normal.     Palpations: Abdomen is soft.  Musculoskeletal: Normal range of motion.     Comments: Right wrist normal in appearance without visible swelling or bony deformities, pain with attempted ROM, tenderness most pronounced along radial aspect including in the anatomic snuffbox; strong radial pulse and cap refill; normal distal sensation and perfusion to all fingers  Skin:    General: Skin is warm and dry.  Neurological:     Mental Status: She is alert and oriented to person, place, and time.      ED Treatments / Results  Labs (all labs ordered are listed, but only abnormal results are displayed) Labs Reviewed - No data to display  EKG None  Radiology  Dg Wrist Complete Right  Result Date: 02/15/2019 CLINICAL DATA:  Right wrist pain after injury. EXAM: RIGHT WRIST - COMPLETE 3+ VIEW COMPARISON:  Hand radiograph 08/17/2012 FINDINGS: There is no evidence of fracture or dislocation. Mild ulna minus variance as before. There is no evidence of arthropathy or other focal bone abnormality. Soft tissues are unremarkable. IMPRESSION: No fracture or subluxation of the right wrist. Electronically Signed   By: Narda Rutherford M.D.   On: 02/15/2019 22:24    Procedures Procedures (including critical care time)  Medications Ordered in ED Medications - No data to display   Initial Impression / Assessment and Plan / ED Course  I have reviewed the triage vital signs and the nursing notes.  Pertinent labs & imaging results that were available during my care of the patient were reviewed by me and considered in my medical decision making (see chart for details).  53 year old female here after a mechanical fall.  She went outside and slipped in some rocks, attempted to break her fall with right wrist.  No head injury or loss of consciousness.  She presents with right wrist pain, notably along the radial aspect.  There is no swelling or gross deformity of the wrist.  Right hand is neurovascularly intact.  X-rays negative.  She was placed in wrist splint, encouraged ice and elevation.  Will start anti-inflammatories.  Can follow-up with PCP.  Return here for any new or acute changes.  Final Clinical Impressions(s) / ED Diagnoses   Final diagnoses:  Right wrist pain    ED Discharge Orders         Ordered    ibuprofen (ADVIL,MOTRIN) 800 MG tablet  3 times daily     02/15/19 2334           Garlon Hatchet, PA-C 02/16/19 0046    Lorre Nick, MD 02/16/19 [id number]

## 2019-02-15 NOTE — Discharge Instructions (Signed)
Take the prescribed medication as directed.  Can use ice and elevation. Follow-up with your primary care doctor. Return to the ED for new or worsening symptoms.

## 2019-02-15 NOTE — ED Triage Notes (Signed)
Patient steps on some rocks at work and tried to catch herself. Patient right wrist is a little swollen.

## 2019-08-12 ENCOUNTER — Other Ambulatory Visit (HOSPITAL_COMMUNITY): Payer: Self-pay | Admitting: *Deleted

## 2019-08-12 DIAGNOSIS — N644 Mastodynia: Secondary | ICD-10-CM

## 2019-09-19 ENCOUNTER — Other Ambulatory Visit: Payer: Self-pay

## 2019-09-19 ENCOUNTER — Ambulatory Visit (HOSPITAL_COMMUNITY): Payer: Self-pay

## 2020-04-27 DIAGNOSIS — K30 Functional dyspepsia: Secondary | ICD-10-CM | POA: Diagnosis not present

## 2020-04-27 DIAGNOSIS — E559 Vitamin D deficiency, unspecified: Secondary | ICD-10-CM | POA: Diagnosis not present

## 2020-04-27 DIAGNOSIS — N644 Mastodynia: Secondary | ICD-10-CM | POA: Diagnosis not present

## 2020-04-27 DIAGNOSIS — Z0000 Encounter for general adult medical examination without abnormal findings: Secondary | ICD-10-CM | POA: Diagnosis not present

## 2020-04-27 DIAGNOSIS — Z13220 Encounter for screening for lipoid disorders: Secondary | ICD-10-CM | POA: Diagnosis not present

## 2020-04-27 DIAGNOSIS — M545 Low back pain: Secondary | ICD-10-CM | POA: Diagnosis not present

## 2020-04-27 DIAGNOSIS — Z131 Encounter for screening for diabetes mellitus: Secondary | ICD-10-CM | POA: Diagnosis not present

## 2020-04-28 ENCOUNTER — Other Ambulatory Visit: Payer: Self-pay | Admitting: Internal Medicine

## 2020-04-28 DIAGNOSIS — N644 Mastodynia: Secondary | ICD-10-CM

## 2021-08-17 DIAGNOSIS — Z13220 Encounter for screening for lipoid disorders: Secondary | ICD-10-CM | POA: Diagnosis not present

## 2021-08-17 DIAGNOSIS — I7300 Raynaud's syndrome without gangrene: Secondary | ICD-10-CM | POA: Diagnosis not present

## 2021-08-17 DIAGNOSIS — M25511 Pain in right shoulder: Secondary | ICD-10-CM | POA: Diagnosis not present

## 2021-08-17 DIAGNOSIS — Z131 Encounter for screening for diabetes mellitus: Secondary | ICD-10-CM | POA: Diagnosis not present

## 2021-08-17 DIAGNOSIS — Z0000 Encounter for general adult medical examination without abnormal findings: Secondary | ICD-10-CM | POA: Diagnosis not present

## 2021-08-17 DIAGNOSIS — E2839 Other primary ovarian failure: Secondary | ICD-10-CM | POA: Diagnosis not present

## 2021-08-19 ENCOUNTER — Other Ambulatory Visit: Payer: Self-pay | Admitting: Internal Medicine

## 2021-08-19 DIAGNOSIS — M25519 Pain in unspecified shoulder: Secondary | ICD-10-CM | POA: Diagnosis not present

## 2021-08-19 DIAGNOSIS — Z13220 Encounter for screening for lipoid disorders: Secondary | ICD-10-CM | POA: Diagnosis not present

## 2021-08-19 DIAGNOSIS — E559 Vitamin D deficiency, unspecified: Secondary | ICD-10-CM | POA: Diagnosis not present

## 2021-08-19 DIAGNOSIS — I7300 Raynaud's syndrome without gangrene: Secondary | ICD-10-CM | POA: Diagnosis not present

## 2021-08-20 LAB — LIPID PANEL
Cholesterol: 208 mg/dL — ABNORMAL HIGH (ref <200)
HDL: 104 mg/dL (ref >=50)
LDL Cholesterol (Calc): 89 mg/dL (calc)
Non-HDL Cholesterol (Calc): 104 mg/dL (calc) (ref <130)
Total CHOL/HDL Ratio: 2 (calc) (ref <5.0)
Triglycerides: 68 mg/dL (ref <150)

## 2021-08-20 LAB — COMPLETE METABOLIC PANEL WITH GFR
AG Ratio: 1.5 (calc) (ref 1.0–2.5)
ALT: 12 U/L (ref 6–29)
AST: 15 U/L (ref 10–35)
Albumin: 4.8 g/dL (ref 3.6–5.1)
Alkaline phosphatase (APISO): 53 U/L (ref 37–153)
BUN: 15 mg/dL (ref 7–25)
CO2: 26 mmol/L (ref 20–32)
Calcium: 9.7 mg/dL (ref 8.6–10.4)
Chloride: 102 mmol/L (ref 98–110)
Creat: 0.54 mg/dL (ref 0.50–1.03)
Globulin: 3.2 g/dL (calc) (ref 1.9–3.7)
Glucose, Bld: 104 mg/dL — ABNORMAL HIGH (ref 65–99)
Potassium: 3.6 mmol/L (ref 3.5–5.3)
Sodium: 139 mmol/L (ref 135–146)
Total Bilirubin: 0.4 mg/dL (ref 0.2–1.2)
Total Protein: 8 g/dL (ref 6.1–8.1)
eGFR: 108 mL/min/{1.73_m2} (ref >=60)

## 2021-08-20 LAB — CBC
HCT: 41.1 % (ref 35.0–45.0)
Hemoglobin: 13.6 g/dL (ref 11.7–15.5)
MCH: 29.3 pg (ref 27.0–33.0)
MCHC: 33.1 g/dL (ref 32.0–36.0)
MCV: 88.6 fL (ref 80.0–100.0)
MPV: 10.3 fL (ref 7.5–12.5)
Platelets: 235 10*3/uL (ref 140–400)
RBC: 4.64 10*6/uL (ref 3.80–5.10)
RDW: 14.1 % (ref 11.0–15.0)
WBC: 4.3 10*3/uL (ref 3.8–10.8)

## 2021-08-20 LAB — TSH: TSH: 1.09 mIU/L (ref 0.40–4.50)

## 2021-08-20 LAB — VITAMIN D 25 HYDROXY (VIT D DEFICIENCY, FRACTURES): Vit D, 25-Hydroxy: 17 ng/mL — ABNORMAL LOW (ref 30–100)

## 2021-08-24 ENCOUNTER — Other Ambulatory Visit: Payer: Self-pay | Admitting: Internal Medicine

## 2021-08-24 DIAGNOSIS — E2839 Other primary ovarian failure: Secondary | ICD-10-CM

## 2021-08-30 DIAGNOSIS — M7989 Other specified soft tissue disorders: Secondary | ICD-10-CM | POA: Diagnosis not present

## 2022-07-09 ENCOUNTER — Encounter: Payer: Self-pay | Admitting: Internal Medicine

## 2022-07-09 ENCOUNTER — Ambulatory Visit: Payer: Self-pay | Admitting: Internal Medicine

## 2022-07-09 VITALS — BP 133/86 | HR 98 | Temp 97.7°F | Ht 68.0 in | Wt 143.6 lb

## 2022-07-09 DIAGNOSIS — R079 Chest pain, unspecified: Secondary | ICD-10-CM | POA: Insufficient documentation

## 2022-07-09 NOTE — Progress Notes (Signed)
   Acute Office Visit  Subjective:     Patient ID: Brittany [name], female    DOB: 09-17-1966, 57 y.o.   MRN: [id number]  Chief Complaint  Patient presents with   Chest Pain    Py reports sharp pain on left side of chest which goes down to back. Pt experiences cramps on left side as well. Started last Monday     HPI Patient is in today for right sided breast pain, Intermittent for about 2-3 months. Started to have pain in right axilla radiating to breast on Monday and has been constant since. Sharp in nature without associated symptoms, relieved with Ibuprofen. She went to urgent care and was given script for Ibuprofen 600 mg TID which she has taken with some relief. No pain at the time.  ROS As above, all other negative     Objective:    BP 133/86 (BP Location: Right Arm, Patient Position: Sitting, Cuff Size: Normal)   Pulse 98   Temp 97.7 F (36.5 C) (Oral)   Ht 5\' 8"  (1.727 m)   Wt 143 lb 9.6 oz (65.1 kg)   LMP 12/30/2013   BMI 21.83 kg/m    Physical Exam CV- RRR , no M/R/G PULM- CTA bilaterally Musculoskeletal. Reproducible right chest pain Reproducible right sided pain. GI- No epigastic tenderness Ext- no edema  No results found for any visits on 07/09/22.      Assessment & Plan:   Problem List Items Addressed This Visit   None  Continue Ibuprofen as advised  She has not had mammogram for 2-3 year. Will schedule for one     No follow-ups on file.  07/11/22, MD

## 2022-08-15 ENCOUNTER — Other Ambulatory Visit: Payer: Self-pay | Admitting: Internal Medicine

## 2022-08-15 DIAGNOSIS — R079 Chest pain, unspecified: Secondary | ICD-10-CM

## 2022-10-08 NOTE — Addendum Note (Signed)
Addended by: Mordecai Rasmussen on: 10/08/2022 02:34 PM   Modules accepted: Orders

## 2022-10-17 ENCOUNTER — Telehealth: Payer: Self-pay | Admitting: *Deleted

## 2022-10-31 ENCOUNTER — Other Ambulatory Visit: Payer: Self-pay

## 2022-10-31 DIAGNOSIS — R079 Chest pain, unspecified: Secondary | ICD-10-CM

## 2022-11-10 ENCOUNTER — Ambulatory Visit: Payer: Self-pay

## 2022-11-17 ENCOUNTER — Other Ambulatory Visit: Payer: Self-pay

## 2023-10-13 NOTE — Telephone Encounter (Signed)
Error
